# Patient Record
Sex: Female | Born: 1935 | Race: White | Hispanic: No | Marital: Married | Smoking: Former smoker
Health system: Southern US, Community
[De-identification: ages and names within clinical notes are randomized; demographics above are authoritative.]

## PROBLEM LIST (undated history)

## (undated) DIAGNOSIS — I2510 Atherosclerotic heart disease of native coronary artery without angina pectoris: Secondary | ICD-10-CM

## (undated) DIAGNOSIS — B9620 Unspecified Escherichia coli [E. coli] as the cause of diseases classified elsewhere: Secondary | ICD-10-CM

## (undated) DIAGNOSIS — H269 Unspecified cataract: Secondary | ICD-10-CM

## (undated) DIAGNOSIS — M1710 Unilateral primary osteoarthritis, unspecified knee: Secondary | ICD-10-CM

## (undated) DIAGNOSIS — Z9289 Personal history of other medical treatment: Secondary | ICD-10-CM

## (undated) DIAGNOSIS — I2584 Coronary atherosclerosis due to calcified coronary lesion: Secondary | ICD-10-CM

## (undated) DIAGNOSIS — K219 Gastro-esophageal reflux disease without esophagitis: Secondary | ICD-10-CM

## (undated) DIAGNOSIS — I10 Essential (primary) hypertension: Secondary | ICD-10-CM

## (undated) DIAGNOSIS — E785 Hyperlipidemia, unspecified: Secondary | ICD-10-CM

## (undated) DIAGNOSIS — I5030 Unspecified diastolic (congestive) heart failure: Secondary | ICD-10-CM

## (undated) DIAGNOSIS — I4891 Unspecified atrial fibrillation: Secondary | ICD-10-CM

## (undated) DIAGNOSIS — M179 Osteoarthritis of knee, unspecified: Secondary | ICD-10-CM

## (undated) DIAGNOSIS — E669 Obesity, unspecified: Secondary | ICD-10-CM

## (undated) DIAGNOSIS — N390 Urinary tract infection, site not specified: Secondary | ICD-10-CM

## (undated) HISTORY — DX: Atherosclerotic heart disease of native coronary artery without angina pectoris: I25.10

## (undated) HISTORY — DX: Unspecified atrial fibrillation: I48.91

## (undated) HISTORY — DX: Personal history of other medical treatment: Z92.89

## (undated) HISTORY — DX: Hyperlipidemia, unspecified: E78.5

## (undated) HISTORY — DX: Urinary tract infection, site not specified: B96.20

## (undated) HISTORY — DX: Gastro-esophageal reflux disease without esophagitis: K21.9

## (undated) HISTORY — PX: US ECHOCARDIOGRAPHY: HXRAD669

## (undated) HISTORY — DX: Unspecified diastolic (congestive) heart failure: I50.30

## (undated) HISTORY — DX: Essential (primary) hypertension: I10

## (undated) HISTORY — DX: Coronary atherosclerosis due to calcified coronary lesion: I25.84

## (undated) HISTORY — DX: Obesity, unspecified: E66.9

## (undated) HISTORY — DX: Urinary tract infection, site not specified: N39.0

## (undated) HISTORY — DX: Unilateral primary osteoarthritis, unspecified knee: M17.10

## (undated) HISTORY — DX: Unspecified cataract: H26.9

## (undated) HISTORY — DX: Osteoarthritis of knee, unspecified: M17.9

---

## 1998-02-15 ENCOUNTER — Emergency Department (HOSPITAL_COMMUNITY): Admission: EM | Admit: 1998-02-15 | Discharge: 1998-02-15 | Payer: Self-pay | Admitting: Emergency Medicine

## 2003-07-04 ENCOUNTER — Encounter: Admission: RE | Admit: 2003-07-04 | Discharge: 2003-09-13 | Payer: Self-pay | Admitting: *Deleted

## 2003-08-04 ENCOUNTER — Ambulatory Visit (HOSPITAL_COMMUNITY): Admission: RE | Admit: 2003-08-04 | Discharge: 2003-08-04 | Payer: Self-pay | Admitting: Gastroenterology

## 2003-10-11 ENCOUNTER — Encounter: Admission: RE | Admit: 2003-10-11 | Discharge: 2004-01-09 | Payer: Self-pay | Admitting: *Deleted

## 2004-01-10 ENCOUNTER — Encounter: Admission: RE | Admit: 2004-01-10 | Discharge: 2004-04-09 | Payer: Self-pay | Admitting: *Deleted

## 2009-03-27 ENCOUNTER — Encounter: Admission: RE | Admit: 2009-03-27 | Discharge: 2009-05-08 | Payer: Self-pay | Admitting: Family Medicine

## 2010-08-23 ENCOUNTER — Encounter: Admission: RE | Admit: 2010-08-23 | Discharge: 2010-08-23 | Payer: Self-pay | Admitting: Interventional Cardiology

## 2010-09-29 HISTORY — PX: CATARACT EXTRACTION, BILATERAL: SHX1313

## 2011-02-14 NOTE — Op Note (Signed)
   NAMEEZINNE, YOGI NO.:  [id number]   MEDICAL RECORD NO.:  [id number]                   PATIENT TYPE:  AMB   LOCATION:  ENDO                                 FACILITY:  [facility]   PHYSICIAN:  James L. Malon Kindle., M.D.          DATE OF BIRTH:  Jul 27, 1932   DATE OF PROCEDURE:  08/04/2003  DATE OF DISCHARGE:                                 OPERATIVE REPORT   PROCEDURE:  Colonoscopy.   MEDICATIONS:  Fentanyl 75 mcg, Versed 9 mg IV.   INDICATIONS FOR PROCEDURE:  Recent heme positive stool and diarrhea.   DESCRIPTION OF PROCEDURE:  The procedure had been explained to the patient  and consent obtained. With the patient in the left lateral decubitus  position, the Olympus pediatric adjustable scope was inserted and advanced.  The prep was excellent. Using abdominal pressure, we were able to reach  around the colon. The ileocecal valve and appendiceal orifice were seen. The  scope was withdrawn and the cecum, ascending colon, transverse colon,  splenic flexure, descending and sigmoid colon were seen well. Scattered  diverticula in the sigmoid colon, no other findings, no polyps or other  lesions. The scope was withdrawn in the rectum and there were internal  hemorrhoids seen in the rectum. The scope withdrawn. The patient tolerated  the procedure well.   ASSESSMENT:  Heme positive stool probably due to internal hemorrhoids.   PLAN:  Will have the patient followup with Dr. Theresia Lo and will see her  back on an as needed basis.                                               James L. Malon Kindle., M.D.    Waldron Session  D:  08/04/2003  T:  08/04/2003  Job:  [id number]   cc:   Vikki Ports, M.D.  [address] [place]  [place]  [place] [address]  Fax: [phone]

## 2011-10-29 ENCOUNTER — Encounter: Payer: Self-pay | Admitting: Emergency Medicine

## 2011-10-30 ENCOUNTER — Encounter: Payer: Self-pay | Admitting: Emergency Medicine

## 2011-10-30 ENCOUNTER — Ambulatory Visit (INDEPENDENT_AMBULATORY_CARE_PROVIDER_SITE_OTHER): Payer: Medicare Other | Admitting: Emergency Medicine

## 2011-10-30 DIAGNOSIS — I2789 Other specified pulmonary heart diseases: Secondary | ICD-10-CM | POA: Insufficient documentation

## 2011-10-30 DIAGNOSIS — I10 Essential (primary) hypertension: Secondary | ICD-10-CM | POA: Insufficient documentation

## 2011-10-30 DIAGNOSIS — E7800 Pure hypercholesterolemia, unspecified: Secondary | ICD-10-CM

## 2011-10-30 DIAGNOSIS — K219 Gastro-esophageal reflux disease without esophagitis: Secondary | ICD-10-CM

## 2011-10-30 DIAGNOSIS — R0609 Other forms of dyspnea: Secondary | ICD-10-CM

## 2011-10-30 DIAGNOSIS — I4891 Unspecified atrial fibrillation: Secondary | ICD-10-CM

## 2011-10-30 DIAGNOSIS — R0600 Dyspnea, unspecified: Secondary | ICD-10-CM

## 2011-10-30 DIAGNOSIS — Z87891 Personal history of nicotine dependence: Secondary | ICD-10-CM

## 2011-10-30 DIAGNOSIS — E785 Hyperlipidemia, unspecified: Secondary | ICD-10-CM | POA: Insufficient documentation

## 2011-10-30 DIAGNOSIS — E669 Obesity, unspecified: Secondary | ICD-10-CM

## 2011-10-30 NOTE — Assessment & Plan Note (Signed)
Suspect due to mild LVH given her left atrial enlargement. I do not believe this is a component of her dyspnea. Her RV size and function are reassuring. No indication for any further workup for PAH at this time

## 2011-10-30 NOTE — Assessment & Plan Note (Signed)
I agree with Dr. Hyacinth Meeker that her acute dyspnea likely related to some mild volume overload and pulmonary edema. She is much improved since starting lasix. Her echocardiogram supports changes do to systemic hypertension and atrial fibrillation. She does have a smoking history in there could be a component of COPD. At this time however she denies any shortness of breath and wants to defer pulmonary function testing. We have agreed to revisit this issue if her breathing changes in any way. She will followup with me if she become short-winded and we will order PFTs at that time.

## 2011-10-30 NOTE — Progress Notes (Signed)
Subjective:    Patient ID: Sabrina Delgado, female    DOB: Nov 17, 1931, 78 y.o.   MRN: [id number]  HPI 78 yo former smoker with hx HTN, hyperlipidemia, A Fib on coumadin. Referred for ? PAH. She had TTE done 10/06/11 that showed mild concentric LVH, LA enlargement, normal RV size and fxn, normal RA size. She has been breathing fairly well w exception of mid-January when she had acute dyspnea. She saw Dr Hyacinth Meeker, diuretics changed from HCTZ to lasix. Much improved at this time, lost 20 lbs. Denies snoring. Husband has never witnessed apneas, feels well rested in the am. She denies any wheezing, cough, chest tightness.    Review of Systems  Constitutional: Positive for unexpected weight change. Negative for fever.  HENT: Negative.  Negative for ear pain, nosebleeds, congestion, sore throat, rhinorrhea, sneezing, trouble swallowing, dental problem, postnasal drip and sinus pressure.   Eyes: Negative.  Negative for redness and itching.  Respiratory: Positive for shortness of breath. Negative for cough, chest tightness and wheezing.   Cardiovascular: Negative.  Negative for palpitations and leg swelling.  Gastrointestinal: Negative.  Negative for nausea and vomiting.  Genitourinary: Negative.  Negative for dysuria.  Musculoskeletal: Negative.  Negative for joint swelling.  Skin: Negative.  Negative for rash.  Neurological: Negative.  Negative for headaches.  Hematological: Negative.  Does not bruise/bleed easily.  Psychiatric/Behavioral: Negative for dysphoric mood. The patient is nervous/anxious.     Past Medical History  Diagnosis Date  . Hypertension   . Hyperlipidemia   . GERD (gastroesophageal reflux disease)   . Osteoarthritis, knee   . Atrial fibrillation   . Obesity   . Glaucoma   . E. coli UTI      Family History  Problem Relation Age of Onset  . Heart disease Father   . Heart disease Brother   . Stomach cancer Mother      History   Social History  . Marital Status:  Married    Spouse Name: N/A    Number of Children: N/A  . Years of Education: N/A   Occupational History  . HOMEMAKER    Social History Main Topics  . Smoking status: Former Smoker -- 1.0 packs/day for 25 years    Types: Cigarettes    Quit date: 09/29/1986  . Smokeless tobacco: Not on file  . Alcohol Use: No  . Drug Use: Not on file  . Sexually Active: Not on file   Other Topics Concern  . Not on file   Social History Narrative   MarriedChildren     No Known Allergies   Outpatient Prescriptions Prior to Visit  Medication Sig Dispense Refill  . acetaminophen (TYLENOL) 325 MG tablet Take 650 mg by mouth every 6 (six) hours as needed.      Marland Kitchen atenolol (TENORMIN) 100 MG tablet Take 100 mg by mouth daily.      . furosemide (LASIX) 20 MG tablet Take 20 mg by mouth every other day.       . lansoprazole (PREVACID) 30 MG capsule Take 30 mg by mouth daily before breakfast.      . lisinopril (PRINIVIL,ZESTRIL) 40 MG tablet Take 40 mg by mouth daily.      . pravastatin (PRAVACHOL) 40 MG tablet Take 40 mg by mouth at bedtime.      Marland Kitchen warfarin (COUMADIN) 2 MG tablet Take 2 mg by mouth daily.      Marland Kitchen desonide (DESOWEN) 0.05 % cream Apply 1 application topically daily.      Marland Kitchen  hydrochlorothiazide (HYDRODIURIL) 25 MG tablet Take 12.5 mg by mouth daily.             Objective:   Physical Exam  Gen: Pleasant, obese woman, in no distress,  normal affect  ENT: No lesions,  mouth clear,  oropharynx clear, no postnasal drip  Neck: No JVD, no TMG, no carotid bruits  Lungs: No use of accessory muscles, no dullness to percussion, clear without rales or rhonchi  Cardiovascular: irregularly irregular, 70's, no murmur or gallops, no peripheral edema  Musculoskeletal: No deformities, no cyanosis or clubbing  Neuro: alert, non focal  Skin: Warm, no lesions or rashes      Assessment & Plan:  Dyspnea I agree with Dr. Hyacinth Meeker that her acute dyspnea likely related to some mild volume overload  and pulmonary edema. She is much improved since starting lasix. Her echocardiogram supports changes do to systemic hypertension and atrial fibrillation. She does have a smoking history in there could be a component of COPD. At this time however she denies any shortness of breath and wants to defer pulmonary function testing. We have agreed to revisit this issue if her breathing changes in any way. She will followup with me if she become short-winded and we will order PFTs at that time.   Atrial fibrillation Good rate control and on Coumadin  Other chronic pulmonary heart diseases Suspect due to mild LVH given her left atrial enlargement. I do not believe this is a component of her dyspnea. Her RV size and function are reassuring. No indication for any further workup for PAH at this time

## 2011-10-30 NOTE — Patient Instructions (Signed)
Your echocardiogram shows changes that are consistent with your history of high blood pressure and atrial fibrillation If your breathing changes in any way in the future, we should do breathing tests to insure that this is not related to your smoking history Follow with Dr Katrinka Blazing as planned Follow with Dr Delton Coombes if your breathing worsens in any way.

## 2011-10-30 NOTE — Assessment & Plan Note (Signed)
Good rate control and on Coumadin

## 2013-04-06 ENCOUNTER — Emergency Department (HOSPITAL_COMMUNITY): Payer: Medicare Other

## 2013-04-06 ENCOUNTER — Inpatient Hospital Stay (HOSPITAL_COMMUNITY)
Admission: EM | Admit: 2013-04-06 | Discharge: 2013-04-09 | DRG: 872 | Disposition: A | Discharge status: Skilled Nursing Facility | Payer: Medicare Other | Attending: Internal Medicine | Admitting: Internal Medicine

## 2013-04-06 ENCOUNTER — Inpatient Hospital Stay (HOSPITAL_COMMUNITY): Payer: Medicare Other

## 2013-04-06 ENCOUNTER — Encounter (HOSPITAL_COMMUNITY): Payer: Self-pay | Admitting: Cardiology

## 2013-04-06 DIAGNOSIS — R0682 Tachypnea, not elsewhere classified: Secondary | ICD-10-CM | POA: Diagnosis present

## 2013-04-06 DIAGNOSIS — E860 Dehydration: Secondary | ICD-10-CM | POA: Diagnosis present

## 2013-04-06 DIAGNOSIS — I10 Essential (primary) hypertension: Secondary | ICD-10-CM | POA: Diagnosis present

## 2013-04-06 DIAGNOSIS — A419 Sepsis, unspecified organism: Principal | ICD-10-CM | POA: Diagnosis present

## 2013-04-06 DIAGNOSIS — W19XXXA Unspecified fall, initial encounter: Secondary | ICD-10-CM

## 2013-04-06 DIAGNOSIS — Y92009 Unspecified place in unspecified non-institutional (private) residence as the place of occurrence of the external cause: Secondary | ICD-10-CM

## 2013-04-06 DIAGNOSIS — K219 Gastro-esophageal reflux disease without esophagitis: Secondary | ICD-10-CM | POA: Diagnosis present

## 2013-04-06 DIAGNOSIS — I4891 Unspecified atrial fibrillation: Secondary | ICD-10-CM

## 2013-04-06 DIAGNOSIS — H409 Unspecified glaucoma: Secondary | ICD-10-CM | POA: Diagnosis present

## 2013-04-06 DIAGNOSIS — D72829 Elevated white blood cell count, unspecified: Secondary | ICD-10-CM | POA: Diagnosis present

## 2013-04-06 DIAGNOSIS — M25552 Pain in left hip: Secondary | ICD-10-CM

## 2013-04-06 DIAGNOSIS — E669 Obesity, unspecified: Secondary | ICD-10-CM | POA: Diagnosis present

## 2013-04-06 DIAGNOSIS — E785 Hyperlipidemia, unspecified: Secondary | ICD-10-CM | POA: Diagnosis present

## 2013-04-06 DIAGNOSIS — M247 Protrusio acetabuli: Secondary | ICD-10-CM | POA: Diagnosis present

## 2013-04-06 DIAGNOSIS — Z79899 Other long term (current) drug therapy: Secondary | ICD-10-CM

## 2013-04-06 DIAGNOSIS — M25559 Pain in unspecified hip: Secondary | ICD-10-CM | POA: Diagnosis present

## 2013-04-06 DIAGNOSIS — Z87891 Personal history of nicotine dependence: Secondary | ICD-10-CM

## 2013-04-06 DIAGNOSIS — Z7901 Long term (current) use of anticoagulants: Secondary | ICD-10-CM

## 2013-04-06 DIAGNOSIS — I498 Other specified cardiac arrhythmias: Secondary | ICD-10-CM | POA: Diagnosis present

## 2013-04-06 DIAGNOSIS — M1710 Unilateral primary osteoarthritis, unspecified knee: Secondary | ICD-10-CM | POA: Diagnosis present

## 2013-04-06 DIAGNOSIS — J9801 Acute bronchospasm: Secondary | ICD-10-CM

## 2013-04-06 DIAGNOSIS — N390 Urinary tract infection, site not specified: Secondary | ICD-10-CM | POA: Diagnosis present

## 2013-04-06 DIAGNOSIS — W108XXA Fall (on) (from) other stairs and steps, initial encounter: Secondary | ICD-10-CM | POA: Diagnosis present

## 2013-04-06 DIAGNOSIS — Z6833 Body mass index (BMI) 33.0-33.9, adult: Secondary | ICD-10-CM

## 2013-04-06 LAB — CBC WITH DIFFERENTIAL/PLATELET
Basophils Absolute: 0.1 10*3/uL (ref 0.0–0.1)
Basophils Relative: 0 % (ref 0–1)
Hemoglobin: 15.7 g/dL — ABNORMAL HIGH (ref 12.0–15.0)
Lymphocytes Relative: 8 % — ABNORMAL LOW (ref 12–46)
MCHC: 33.9 g/dL (ref 30.0–36.0)
Neutro Abs: 13.6 10*3/uL — ABNORMAL HIGH (ref 1.7–7.7)
Neutrophils Relative %: 86 % — ABNORMAL HIGH (ref 43–77)
RDW: 14.4 % (ref 11.5–15.5)
WBC: 15.8 10*3/uL — ABNORMAL HIGH (ref 4.0–10.5)

## 2013-04-06 LAB — URINALYSIS, ROUTINE W REFLEX MICROSCOPIC
Bilirubin Urine: NEGATIVE
Glucose, UA: NEGATIVE mg/dL
Ketones, ur: 15 mg/dL — AB
pH: 5.5 (ref 5.0–8.0)

## 2013-04-06 LAB — URINE MICROSCOPIC-ADD ON

## 2013-04-06 LAB — COMPREHENSIVE METABOLIC PANEL
ALT: 21 U/L (ref 0–35)
AST: 34 U/L (ref 0–37)
Albumin: 3.8 g/dL (ref 3.5–5.2)
Alkaline Phosphatase: 142 U/L — ABNORMAL HIGH (ref 39–117)
CO2: 23 mEq/L (ref 19–32)
Chloride: 106 mEq/L (ref 96–112)
Potassium: 3.6 mEq/L (ref 3.5–5.1)
Total Bilirubin: 1.6 mg/dL — ABNORMAL HIGH (ref 0.3–1.2)

## 2013-04-06 LAB — PROTIME-INR: Prothrombin Time: 23.2 seconds — ABNORMAL HIGH (ref 11.6–15.2)

## 2013-04-06 MED ORDER — SERTRALINE HCL 50 MG PO TABS
50.0000 mg | ORAL_TABLET | Freq: Every day | ORAL | Status: DC
  Administered 2013-04-07 – 2013-04-09 (×3): 50 mg via ORAL
  Filled 2013-04-06 (×4): qty 1

## 2013-04-06 MED ORDER — SODIUM CHLORIDE 0.9 % IV BOLUS (SEPSIS)
1000.0000 mL | Freq: Once | INTRAVENOUS | Status: AC
  Administered 2013-04-06: 1000 mL via INTRAVENOUS

## 2013-04-06 MED ORDER — WARFARIN - PHARMACIST DOSING INPATIENT: Freq: Every day | Status: DC

## 2013-04-06 MED ORDER — WARFARIN SODIUM 1 MG PO TABS
1.0000 mg | ORAL_TABLET | Freq: Once | ORAL | Status: AC
  Administered 2013-04-06: 1 mg via ORAL
  Filled 2013-04-06 (×2): qty 1

## 2013-04-06 MED ORDER — TRAMADOL HCL 50 MG PO TABS
50.0000 mg | ORAL_TABLET | Freq: Two times a day (BID) | ORAL | Status: DC
  Administered 2013-04-06 – 2013-04-09 (×6): 50 mg via ORAL
  Filled 2013-04-06 (×11): qty 1

## 2013-04-06 MED ORDER — SIMVASTATIN 40 MG PO TABS
40.0000 mg | ORAL_TABLET | Freq: Every day | ORAL | Status: DC
  Administered 2013-04-06 – 2013-04-08 (×3): 40 mg via ORAL
  Filled 2013-04-06 (×5): qty 1

## 2013-04-06 MED ORDER — WARFARIN SODIUM 1 MG PO TABS: 1.0000 mg | ORAL_TABLET | Freq: Every morning | ORAL | Status: DC

## 2013-04-06 MED ORDER — SODIUM CHLORIDE 0.9 % IV SOLN
Freq: Once | INTRAVENOUS | Status: AC
  Administered 2013-04-06: 15:00:00 via INTRAVENOUS

## 2013-04-06 MED ORDER — ONDANSETRON HCL 4 MG/2ML IJ SOLN: 4.0000 mg | Freq: Three times a day (TID) | INTRAMUSCULAR | Status: AC | PRN

## 2013-04-06 MED ORDER — DEXTROSE 5 % IV SOLN
1.0000 g | INTRAVENOUS | Status: DC
  Administered 2013-04-07 – 2013-04-08 (×2): 1 g via INTRAVENOUS
  Filled 2013-04-06 (×3): qty 10

## 2013-04-06 MED ORDER — LORAZEPAM 0.5 MG PO TABS
0.2500 mg | ORAL_TABLET | ORAL | Status: DC | PRN
  Administered 2013-04-08: 0.25 mg via ORAL
  Filled 2013-04-06: qty 1

## 2013-04-06 MED ORDER — SODIUM CHLORIDE 0.9 % IV SOLN
INTRAVENOUS | Status: DC
  Administered 2013-04-07: 04:00:00 via INTRAVENOUS

## 2013-04-06 MED ORDER — SODIUM CHLORIDE 0.9 % IJ SOLN
3.0000 mL | Freq: Two times a day (BID) | INTRAMUSCULAR | Status: DC
  Administered 2013-04-06 – 2013-04-08 (×4): 3 mL via INTRAVENOUS

## 2013-04-06 MED ORDER — LISINOPRIL 40 MG PO TABS
40.0000 mg | ORAL_TABLET | Freq: Every day | ORAL | Status: DC
  Administered 2013-04-07 – 2013-04-09 (×3): 40 mg via ORAL
  Filled 2013-04-06 (×4): qty 1

## 2013-04-06 MED ORDER — SODIUM CHLORIDE 0.9 % IV SOLN
INTRAVENOUS | Status: AC
  Administered 2013-04-06: 22:00:00 via INTRAVENOUS

## 2013-04-06 MED ORDER — ATENOLOL 100 MG PO TABS
100.0000 mg | ORAL_TABLET | Freq: Every day | ORAL | Status: DC
  Filled 2013-04-06 (×2): qty 1

## 2013-04-06 MED ORDER — ALBUTEROL SULFATE (5 MG/ML) 0.5% IN NEBU
INHALATION_SOLUTION | RESPIRATORY_TRACT | Status: AC
  Administered 2013-04-06: 5 mg
  Filled 2013-04-06: qty 1

## 2013-04-06 MED ORDER — DEXTROSE 5 % IV SOLN
1.0000 g | Freq: Once | INTRAVENOUS | Status: AC
  Administered 2013-04-06: 1 g via INTRAVENOUS
  Filled 2013-04-06: qty 10

## 2013-04-06 MED ORDER — ALBUTEROL SULFATE (5 MG/ML) 0.5% IN NEBU: 2.5000 mg | INHALATION_SOLUTION | RESPIRATORY_TRACT | Status: AC | PRN

## 2013-04-06 NOTE — ED Provider Notes (Signed)
Medical screening examination/treatment/procedure(s) were conducted as a shared visit with non-physician practitioner(s) and myself.  I personally evaluated the patient during the encounter  Subjective: Patient here to being found on the floor for 10 hours after she tripped.  Objective: Moderate wheezing noted on exam. No shortening or rotation of her hips for pain with range of motion.  Assessment/plan. Patient given albuterol here for wheezing. Labs and x-rays reviewed. Patient to have a CT of her head rule out fracture patient also has a urinary tract infection 2. Will be admitted to medicine  Toy Baker, MD 04/06/13 1527

## 2013-04-06 NOTE — ED Provider Notes (Signed)
History    CSN: [id number] Arrival date & time 04/06/13  1239  First MD Initiated Contact with Patient 04/06/13 1243     Chief Complaint  Patient presents with  . Fall   (Consider location/radiation/quality/duration/timing/severity/associated sxs/prior Treatment) HPI Comments: Patient reports that she fell down three stairs last night and landed on her back, was there all night. Daughter reports the patient does not go up and down stairs and was found this morning by her chair.  States she has been having occasional numbness in her left leg after sitting for extended periods of time and sometimes has difficulty feeling her leg when she first stands up - she thinks this may have caused the fall.  States she spoke with her on the phone at 9pm, then patient was found by caregiver who comes in at 11am.  Found on the floor by her chair.  Pt reports she was able to sleep on the floor a little, states she has not eaten or had anything to drink since last night.  Reports pain in her mid back only.  Denies pain anywhere else.  Denies recent illness, denies fever, chills, CP, SOB, cough, abdominal pain, N/V/D.  States she was eating and drinking well until last night.  Only problem recently has been left leg pain, is in physical therapy for this.      Patient is a 79 y.o. female presenting with fall. The history is provided by the patient and a relative.  Fall Pertinent negatives include no abdominal pain, chest pain, chills, coughing, fever, nausea, numbness, vomiting or weakness.   Past Medical History  Diagnosis Date  . Hypertension   . Hyperlipidemia   . GERD (gastroesophageal reflux disease)   . Osteoarthritis, knee   . Atrial fibrillation   . Obesity   . Glaucoma   . E. coli UTI    Past Surgical History  Procedure Laterality Date  . Cataract extraction, bilateral  1 2012   Family History  Problem Relation Age of Onset  . Heart disease Father   . Heart disease Brother   . Stomach  cancer Mother    History  Substance Use Topics  . Smoking status: Former Smoker -- 1.00 packs/day for 25 years    Types: Cigarettes    Quit date: 09/29/1986  . Smokeless tobacco: Not on file  . Alcohol Use: No   OB History   Grav Para Term Preterm Abortions TAB SAB Ect Mult Living                 Review of Systems  Constitutional: Negative for fever, chills, activity change and appetite change.  Respiratory: Negative for cough and shortness of breath.   Cardiovascular: Negative for chest pain.  Gastrointestinal: Negative for nausea, vomiting, abdominal pain and diarrhea.  Genitourinary: Negative for dysuria, urgency and frequency.  Musculoskeletal: Positive for back pain.  Skin: Negative for wound.  Neurological: Negative for weakness and numbness.  Psychiatric/Behavioral: Positive for confusion.  All other systems reviewed and are negative.    Allergies  Review of patient's allergies indicates no known allergies.  Home Medications   Current Outpatient Rx  Name  Route  Sig  Dispense  Refill  . acetaminophen (TYLENOL) 325 MG tablet   Oral   Take 650 mg by mouth every 6 (six) hours as needed.         Marland Kitchen atenolol (TENORMIN) 100 MG tablet   Oral   Take 100 mg by mouth daily.         Marland Kitchen  furosemide (LASIX) 20 MG tablet   Oral   Take 20 mg by mouth every other day.          . lansoprazole (PREVACID) 30 MG capsule   Oral   Take 30 mg by mouth daily before breakfast.         . lisinopril (PRINIVIL,ZESTRIL) 40 MG tablet   Oral   Take 40 mg by mouth daily.         . pravastatin (PRAVACHOL) 40 MG tablet   Oral   Take 40 mg by mouth at bedtime.         Marland Kitchen warfarin (COUMADIN) 2 MG tablet   Oral   Take 2 mg by mouth daily.          BP 136/64  Pulse 58  Temp(Src) 97.8 F (36.6 C) (Rectal)  Resp 20  SpO2 94% Physical Exam  Nursing note and vitals reviewed. Constitutional: She appears well-developed and well-nourished. No distress.  HENT:  Head:  Normocephalic and atraumatic.  Neck: Neck supple.  Cardiovascular: Normal rate, regular rhythm and intact distal pulses.   Pulmonary/Chest: Effort normal and breath sounds normal. No stridor. No respiratory distress. She has no wheezes. She has no rales.  Abdominal: Soft. She exhibits no distension. There is no tenderness. There is no rebound and no guarding.  Musculoskeletal:  Pt initially examined in hallway, pt left clothed. Left hip pain with passive ROM.   Back is nontender.   Moves all extremities.   Neurological: She is alert. She exhibits normal muscle tone. GCS eye subscore is 4. GCS verbal subscore is 5. GCS motor subscore is 6.  Right leg strength and upper extremity strength normal.  Left leg strength decreased - per daughter this is chronic.   Skin: She is not diaphoretic.    ED Course  Procedures (including critical care time) Labs Reviewed  CBC WITH DIFFERENTIAL - Abnormal; Notable for the following:    WBC 15.8 (*)    RBC 5.23 (*)    Hemoglobin 15.7 (*)    HCT 46.3 (*)    Neutrophils Relative % 86 (*)    Neutro Abs 13.6 (*)    Lymphocytes Relative 8 (*)    All other components within normal limits  COMPREHENSIVE METABOLIC PANEL - Abnormal; Notable for the following:    Glucose, Bld 118 (*)    Alkaline Phosphatase 142 (*)    Total Bilirubin 1.6 (*)    GFR calc non Af Amer 83 (*)    All other components within normal limits  CK - Abnormal; Notable for the following:    Total CK 561 (*)    All other components within normal limits  URINALYSIS, ROUTINE W REFLEX MICROSCOPIC - Abnormal; Notable for the following:    APPearance CLOUDY (*)    Hgb urine dipstick LARGE (*)    Ketones, ur 15 (*)    Protein, ur 100 (*)    Nitrite POSITIVE (*)    Leukocytes, UA SMALL (*)    All other components within normal limits  URINE MICROSCOPIC-ADD ON - Abnormal; Notable for the following:    Squamous Epithelial / LPF FEW (*)    Bacteria, UA MANY (*)    Casts GRANULAR CAST (*)     All other components within normal limits  URINE CULTURE  PROTIME-INR   Dg Chest 1 View  04/06/2013   *RADIOLOGY REPORT*  Clinical Data: Recent fall.  Back pain and chest pain.  CHEST - 1 VIEW  Comparison: .  10/02/2011.  Findings: Cardiomegaly.  Interstitial and alveolar prominence could be related in part to supine radiography, but early congestive failure is not excluded.  Calcified tortuous aorta.  No rib fracture or pneumothorax. Worsening aeration from priors.  IMPRESSION: Cardiomegaly.  Early congestive failure not excluded.  No pneumothorax is seen.   Original Report Authenticated By: Davonna Belling, M.D.   Dg Thoracic Spine 2 View  04/06/2013   *RADIOLOGY REPORT*  Clinical Data: Larey Seat.  Back pain.  THORACIC SPINE - 2 VIEW  Comparison: Chest x-ray 10/02/2011.  Findings: Slight mid thoracic compression fracture is stable from 2013.  Multilevel degenerative change with disc space narrowing. Osteopenia.  No acute compression deformity is evident.  Mild scoliosis convex right lower thoracic region.  Bilateral interstitial lung disease.  Calcified tortuous aorta.  IMPRESSION: No definite acute osseous findings.   Original Report Authenticated By: Davonna Belling, M.D.   Dg Lumbar Spine Complete  04/06/2013   *RADIOLOGY REPORT*  Clinical Data: Larey Seat, back pain.  LUMBAR SPINE - COMPLETE 4+ VIEW  Comparison: None.  Findings: Degenerative scoliosis mildly convex left mid lumbar region.  Multilevel degenerative change most notable at L4-5 and L5- S1.  No definite acute compression deformity.  Mild endplate softening above and below the L4-5 level appears chronic.  Lower lumbar facet arthropathy.  Vascular calcification.  IMPRESSION: No definite acute lumbar compression deformity.   Original Report Authenticated By: Davonna Belling, M.D.   Dg Hip Complete Left  04/06/2013   *RADIOLOGY REPORT*  Clinical Data: Larey Seat, hip pain, unable to see a  LEFT HIP - COMPLETE 2+ VIEW  Comparison: None.  Findings: The appearance of  the left hip and acetabulum is markedly abnormal, with significant protrusio acetabuli, remodeled pubic and ischial rami, and a deformed misshapen femoral head.  This appearance is likely chronic although it is difficult to exclude a superimposed acute injury.  If there is concern for acute pelvic or hip fracture, CT would be necessary.  There is advanced osteopenia. The right hip is grossly unremarkable.  IMPRESSION: Severe left protrusio acetabuli, with a deformed and misshapen left femoral head.  Difficult to exclude a superimposed acute abnormality.  Correlate clinically.   Original Report Authenticated By: Davonna Belling, M.D.   Ct Head Wo Contrast  04/06/2013   *RADIOLOGY REPORT*  Clinical Data:  Fall, altered mental status, head pain, neck pain  CT HEAD WITHOUT CONTRAST CT CERVICAL SPINE WITHOUT CONTRAST  Technique:  Multidetector CT imaging of the head and cervical spine was performed following the standard protocol without intravenous contrast.  Multiplanar CT image reconstructions of the cervical spine were also generated.  Comparison:   None  CT HEAD  Findings: There is no evidence for acute infarction, intracranial hemorrhage, mass lesion, hydrocephalus, or extra-axial fluid.  Mild atrophy.  Chronic microvascular ischemic change, moderate degree. Possible remote right frontal cortical infarct.   Calvarium intact. Right parietal scalp hematoma.  There is no subarachnoid blood. Hyperostosis frontalis interna.  Left maxillary sinus air-fluid level. Minimal fluid layering in the sphenoid sinus.  Mild mucosal thickening in the ethmoid sinuses.  No orbital findings.  Bilateral cataract extraction.  No mastoid fluid.  IMPRESSION: No acute intracranial findings.  Mild atrophy and chronic microvascular ischemic change. Right parietal scalp hematoma. Sinus air-fluid levels could be incidental/inflammatory, as there is no visible facial deformity.  CT CERVICAL SPINE  Findings: No visible cervical spine fracture or  traumatic subluxation.  Multilevel disc space narrowing without prevertebral soft tissue swelling or intraspinal hematoma. Mild pannus  is non compressive. Functional fusion C4-C5. Multilevel spondylosis with uncinate spurring and facet arthropathy narrow the neural foramina, left greater than right, at multiple levels.  Mild atheromatous change.  No pneumothorax.  IMPRESSION: Spondylosis.  No acute findings.   Original Report Authenticated By: Davonna Belling, M.D.   Ct Cervical Spine Wo Contrast  04/06/2013   *RADIOLOGY REPORT*  Clinical Data:  Fall, altered mental status, head pain, neck pain  CT HEAD WITHOUT CONTRAST CT CERVICAL SPINE WITHOUT CONTRAST  Technique:  Multidetector CT imaging of the head and cervical spine was performed following the standard protocol without intravenous contrast.  Multiplanar CT image reconstructions of the cervical spine were also generated.  Comparison:   None  CT HEAD  Findings: There is no evidence for acute infarction, intracranial hemorrhage, mass lesion, hydrocephalus, or extra-axial fluid.  Mild atrophy.  Chronic microvascular ischemic change, moderate degree. Possible remote right frontal cortical infarct.   Calvarium intact. Right parietal scalp hematoma.  There is no subarachnoid blood. Hyperostosis frontalis interna.  Left maxillary sinus air-fluid level. Minimal fluid layering in the sphenoid sinus.  Mild mucosal thickening in the ethmoid sinuses.  No orbital findings.  Bilateral cataract extraction.  No mastoid fluid.  IMPRESSION: No acute intracranial findings.  Mild atrophy and chronic microvascular ischemic change. Right parietal scalp hematoma. Sinus air-fluid levels could be incidental/inflammatory, as there is no visible facial deformity.  CT CERVICAL SPINE  Findings: No visible cervical spine fracture or traumatic subluxation.  Multilevel disc space narrowing without prevertebral soft tissue swelling or intraspinal hematoma. Mild pannus is non compressive.  Functional fusion C4-C5. Multilevel spondylosis with uncinate spurring and facet arthropathy narrow the neural foramina, left greater than right, at multiple levels.  Mild atheromatous change.  No pneumothorax.  IMPRESSION: Spondylosis.  No acute findings.   Original Report Authenticated By: Davonna Belling, M.D.   Dg Chest Portable 1 View  04/06/2013   *RADIOLOGY REPORT*  Clinical Data: Fall with shortness of breath.  Hypertension.  PORTABLE CHEST - 1 VIEW  Comparison: Portable film earlier in the day.  Findings: Semi erect film demonstrates continued cardiac enlargement with interstitial and alveolar prominence.  There is slight redistribution of fluid to the mid and lower lung zones but considerable edema remains.  Congestive failure not excluded.  No pneumothorax.  No rib fracture.  IMPRESSION: Continued cardiac enlargement with persistent interstitial and alveolar prominence.  Little change from earlier despite semi erect portable radiograph positioning.   Original Report Authenticated By: Davonna Belling, M.D.    1:28 PM Dr Freida Busman made aware of the patient.   3:36 PM Pt seen again with Dr Freida Busman.  Discussed results.  Pt became tachypneic - she and family member state this happen when she is nervous.  Has some wheezing on exam.  1 view portable chest and albuterol ordered.    1. Fall, initial encounter   2. UTI (lower urinary tract infection)   3. Dehydration   4. Left hip pain   5. Bronchospasm     MDM  Pt with fall, on floor overnight, c/o back pain and chronic left hip pain.  Pt found to be dehydrated (heme concentrated, CK elevated) with UTI (pos nitrites, WBC elev), ?changes on left hip xray.  Elevated bilirubin and alk phos - pt without abdominal pain or tenderness.  Given IVF and rocephin in ED.  CT hip ordered to r/o fracture.  See above discussion of tachypnea - pt denies SOB, she and family member state this is her normal anxiety  reaction. Admitted to [facility].     [place],  PA-C 04/06/13 1601

## 2013-04-06 NOTE — ED Notes (Signed)
Pt to department via EMS- reports that she fell at 0100 this morning and has been on the floor since then. EMS reports that they found her on the floor this morning. Pt is alert and oriented but family reports that she seems "alittle off to her". Pt reports lower back pain. Pt reports that she has trouble with her leg and that's what caused her to fall. On coumadin for a-fib, no bleeding noted. Bp- 132/68 Hr-64 CBG-111

## 2013-04-06 NOTE — ED Notes (Signed)
Admitting MD at the bedside.  
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
## 2013-04-06 NOTE — ED Notes (Signed)
Breathing tx completed, PA informed. Pt presents with wheezing after breathing tx however the wheezing has decreased from the tx. PA informed. PA states she will reevaluate pt for further orders at this time. Family at the bedside. Pt presents with anxiety.

## 2013-04-06 NOTE — Progress Notes (Signed)
ANTICOAGULATION CONSULT NOTE - Initial Consult  Pharmacy Consult for warfarin Indication: atrial fibrillation  No Known Allergies  Vital Signs: Temp: 97.9 F (36.6 C) (07/09 1545) Temp src: Oral (07/09 1545) BP: 141/60 mmHg (07/09 1635) Pulse Rate: 58 (07/09 1635)  Labs:  Recent Labs  04/06/13 1319 04/06/13 1919  HGB 15.7*  --   HCT 46.3*  --   PLT 211  --   LABPROT  --  23.2*  INR  --  2.14*  CREATININE 0.64  --   CKTOTAL 561*  --     The CrCl is unknown because both a height and weight (above a minimum accepted value) are required for this calculation.   Medical History: Past Medical History  Diagnosis Date  . Hypertension   . Hyperlipidemia   . GERD (gastroesophageal reflux disease)   . Osteoarthritis, knee   . Atrial fibrillation   . Obesity   . Glaucoma   . E. coli UTI     Medications:  Warfarin 1mg  PO daily  Assessment: 80 yof s/p fall found down by family on chronic coumadin for afib. To resume coumadin tonight. No evidence of bleeding, H/H 15.7/46.3, plts 211. INR is therapeutic at 2.14.   Goal of Therapy:  INR 2-3   Plan:  1. Warfarin 1mg  PO x 1 tonight 2. Daily INR  Emmagrace Runkel, Drake Leach 04/06/2013,7:52 PM

## 2013-04-07 DIAGNOSIS — I4891 Unspecified atrial fibrillation: Secondary | ICD-10-CM

## 2013-04-07 DIAGNOSIS — E860 Dehydration: Secondary | ICD-10-CM

## 2013-04-07 DIAGNOSIS — M25559 Pain in unspecified hip: Secondary | ICD-10-CM

## 2013-04-07 DIAGNOSIS — N390 Urinary tract infection, site not specified: Secondary | ICD-10-CM

## 2013-04-07 DIAGNOSIS — W19XXXA Unspecified fall, initial encounter: Secondary | ICD-10-CM

## 2013-04-07 LAB — COMPREHENSIVE METABOLIC PANEL
ALT: 19 U/L (ref 0–35)
AST: 34 U/L (ref 0–37)
CO2: 22 mEq/L (ref 19–32)
Calcium: 8.6 mg/dL (ref 8.4–10.5)
Potassium: 3 mEq/L — ABNORMAL LOW (ref 3.5–5.1)
Sodium: 143 mEq/L (ref 135–145)
Total Protein: 6 g/dL (ref 6.0–8.3)

## 2013-04-07 LAB — PROTIME-INR: INR: 2.36 — ABNORMAL HIGH (ref 0.00–1.49)

## 2013-04-07 LAB — CBC
HCT: 39.7 % (ref 36.0–46.0)
Hemoglobin: 13 g/dL (ref 12.0–15.0)
MCH: 29.4 pg (ref 26.0–34.0)
MCHC: 32.7 g/dL (ref 30.0–36.0)

## 2013-04-07 MED ORDER — POTASSIUM CHLORIDE CRYS ER 20 MEQ PO TBCR
40.0000 meq | EXTENDED_RELEASE_TABLET | Freq: Once | ORAL | Status: AC
  Administered 2013-04-07: 40 meq via ORAL
  Filled 2013-04-07: qty 2

## 2013-04-07 MED ORDER — WARFARIN SODIUM 1 MG PO TABS
1.0000 mg | ORAL_TABLET | Freq: Once | ORAL | Status: AC
  Administered 2013-04-07: 1 mg via ORAL
  Filled 2013-04-07: qty 1

## 2013-04-07 MED ORDER — FUROSEMIDE 20 MG PO TABS
20.0000 mg | ORAL_TABLET | Freq: Every day | ORAL | Status: DC
  Administered 2013-04-07 – 2013-04-09 (×3): 20 mg via ORAL
  Filled 2013-04-07 (×3): qty 1

## 2013-04-07 NOTE — Progress Notes (Signed)
ANTICOAGULATION CONSULT NOTE - Follow Up  Pharmacy Consult for Warfarin Indication: atrial fibrillation  No Known Allergies  Vital Signs: Temp: 98.1 F (36.7 C) (07/10 0418) BP: 132/73 mmHg (07/10 0418) Pulse Rate: 59 (07/10 0418)  Labs:  Recent Labs  04/06/13 1319 04/06/13 1919 04/07/13 0750  HGB 15.7*  --  13.0  HCT 46.3*  --  39.7  PLT 211  --  188  LABPROT  --  23.2* 25.0*  INR  --  2.14* 2.36*  CREATININE 0.64  --  0.65  CKTOTAL 561*  --   --     Estimated Creatinine Clearance: 65 ml/min (by C-G formula based on Cr of 0.65).   Assessment: 79 yo F s/p fall found down by family on  7/9. On warfarin prior to admission for hx of Afib. Home warfarin dose reported as 1mg  daily. INR was therapeutic on admission. Transfer[facility] from [facility] to [facility] per patient request.  INR remains therapeutic this am  Will continue to follow usual home regimen  No bleeding events noted in chart notes.  H/H, plts wnl  Goal of Therapy:  INR 2-3   Plan:  1. Warfarin 1mg  PO x 1 tonight 2. Daily INR  Darrol Angel, PharmD Pager: [phone] 04/07/2013,10:43 AM
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
## 2013-04-07 NOTE — Progress Notes (Signed)
Stopped to see patient today. Gave support and listened. Offered prayer for peace and calm.
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
## 2013-04-07 NOTE — Progress Notes (Addendum)
Pt's HR has dropped into the 30s while asleep. Tele notified RN of a HR of 37, MD on call also notified. No new orders were given at this time, as pt is asymptomatic and sleeping comfortably.  Will continue to monitor pt.   Pts HR went down to as low as 30, and MD on call notified again.  Pt has no complaints of pain or feeling bad.  She is currently resting comfortably, and her vitals are stable.  She actually states she has been sleeping well and feels much better. Will continue to monitor.
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
## 2013-04-07 NOTE — Care Management Note (Signed)
    Page 1 of 1   04/07/2013     1:34:34 PM   CARE MANAGEMENT NOTE 04/07/2013  Patient:  Sabrina Delgado,Sabrina Delgado   Account Number:  [id number]  Date Initiated:  04/07/2013  Documentation initiated by:  Lanier Clam  Subjective/Objective Assessment:   ADMITTED W/SEPSIS,UTI,FALL.     Action/Plan:   FROM HOME.HAS PERSONALCAREGIVER.   Anticipated DC Date:  04/11/2013   Anticipated DC Plan:  SKILLED NURSING FACILITY      DC Planning Services  CM consult      Choice offered to / List presented to:             Status of service:  In process, will continue to follow Medicare Important Message given?   (If response is "NO", the following Medicare IM given date fields will be blank) Date Medicare IM given:   Date Additional Medicare IM given:    Discharge Disposition:    Per UR Regulation:  Reviewed for med. necessity/level of care/duration of stay  If discussed at Long Length of Stay Meetings, dates discussed:    Comments:  04/07/13 Hally Colella RN,BSN NCM [phone] PT/OT-SNF.

## 2013-04-08 LAB — BASIC METABOLIC PANEL
BUN: 13 mg/dL (ref 6–23)
Calcium: 9.1 mg/dL (ref 8.4–10.5)
GFR calc non Af Amer: 85 mL/min — ABNORMAL LOW (ref >90)
Glucose, Bld: 113 mg/dL — ABNORMAL HIGH (ref 70–99)

## 2013-04-08 LAB — CBC
HCT: 41.9 % (ref 36.0–46.0)
Hemoglobin: 13.7 g/dL (ref 12.0–15.0)
MCH: 29.4 pg (ref 26.0–34.0)
MCHC: 32.7 g/dL (ref 30.0–36.0)
MCV: 89.9 fL (ref 78.0–100.0)

## 2013-04-08 MED ORDER — POTASSIUM CHLORIDE CRYS ER 20 MEQ PO TBCR
40.0000 meq | EXTENDED_RELEASE_TABLET | Freq: Two times a day (BID) | ORAL | Status: AC
  Administered 2013-04-08 (×2): 40 meq via ORAL
  Filled 2013-04-08 (×2): qty 2

## 2013-04-08 MED ORDER — WARFARIN SODIUM 1 MG PO TABS
1.0000 mg | ORAL_TABLET | Freq: Once | ORAL | Status: AC
  Administered 2013-04-08: 1 mg via ORAL
  Filled 2013-04-08: qty 1

## 2013-04-08 NOTE — Progress Notes (Signed)
[facility] PROGRESS NOTE  Sabrina Delgado AVW:[id number] DOB: Nov 15, 1931 DOA: 04/06/2013 PCP: Neldon Labella, MD  Assessment/Plan: UTI with ? sepsis:  -Pt with leukocytosis and tachypnea in the setting of UTI  -Continue with rocephin day 3.  -Urine culture E coli. Will follow sensitivity to decide which antibiotics use at discharge.  -WBC today at 12.   HTN:  - BP stable. Continue with lisinopril. Hold BB due to bradycardia.  -resume home lasix dose.   A.fib:  - Coumadin pre pharmacy.  - Presently stable.  -Holding BB due to bradycardia.  -Consider resume BB if HR increase.   Fall:  - No acute fracture noted on plain xray  - CT hip negative for acute fracture. Acetabulum protrusio will ask ortho to review CT.  Discussed CT scan results with Dr Carola Frost, patient need PT rehab, needs to follow up with Ortho, Dr Charlann Boxer for evaluation for surgery.   Dehydration:  -No evidence of rhabdo s/p fall.  -NSL fluids, patient tolerating diet.    Bradycardia; holding BB. Asymptomatic.    Code Status:  Family Communication: Care discussed with daughter.  Disposition Plan:  SNF 7-12   Consultants:  none  Procedures:  none  Antibiotics:  Ceftriaxone 7-9  HPI/Subjective: Feeling better. No significant hip pain. Was able to put weight on her left hip.   Objective: Filed Vitals:   04/07/13 1015 04/07/13 1300 04/07/13 2251 04/08/13 0417  BP:  121/77 142/98 133/79  Pulse: 52 60 79 62  Temp:  98.5 F (36.9 C) 97.9 F (36.6 C) 97.8 F (36.6 C)  TempSrc:   Oral Oral  Resp:  22 20 24   Height:      Weight:      SpO2: 82% 95% 93% 91%    Intake/Output Summary (Last 24 hours) at 04/08/13 1320 Last data filed at 04/08/13 0730  Gross per 24 hour  Intake      0 ml  Output    450 ml  Net   -450 ml   Filed Weights   04/06/13 2053  Weight: 91.944 kg (202 lb 11.2 oz)    Exam:   General:  No distress.   Cardiovascular: S 1, S 2 RRR  Respiratory: CTA  Abdomen: Bs  present, soft, NT  Musculoskeletal: no edema.   Data Reviewed: Basic Metabolic Panel:  Recent Labs Lab 04/06/13 1319 04/07/13 0750 04/08/13 0414  NA 143 143 141  K 3.6 3.0* 3.3*  CL 106 109 106  CO2 23 22 24   GLUCOSE 118* 101* 113*  BUN 19 15 13   CREATININE 0.64 0.65 0.61  CALCIUM 9.6 8.6 9.1   Liver Function Tests:  Recent Labs Lab 04/06/13 1319 04/07/13 0750  AST 34 34  ALT 21 19  ALKPHOS 142* 99  BILITOT 1.6* 1.5*  PROT 7.5 6.0  ALBUMIN 3.8 2.8*   No results found for this basename: LIPASE, AMYLASE,  in the last 168 hours No results found for this basename: AMMONIA,  in the last 168 hours CBC:  Recent Labs Lab 04/06/13 1319 04/07/13 0750 04/08/13 0414  WBC 15.8* 10.9* 12.2*  NEUTROABS 13.6*  --   --   HGB 15.7* 13.0 13.7  HCT 46.3* 39.7 41.9  MCV 88.5 89.8 89.9  PLT 211 188 183   Cardiac Enzymes:  Recent Labs Lab 04/06/13 1319  CKTOTAL 561*   BNP (last 3 results) No results found for this basename: PROBNP,  in the last 8760 hours CBG: No results found for this basename: GLUCAP,  in the last 168 hours  Recent Results (from the past 240 hour(s))  URINE CULTURE     Status: None   Collection Time    04/06/13  1:20 PM      Result Value Range Status   Specimen Description URINE, CATHETERIZED   Final   Special Requests NONE   Final   Culture  Setup Time 04/06/2013 17:12   Final   Colony Count >=100,000 COLONIES/ML   Final   Culture ESCHERICHIA COLI   Final   Report Status PENDING   Incomplete     Studies: Dg Chest 1 View  04/06/2013   *RADIOLOGY REPORT*  Clinical Data: Recent fall.  Back pain and chest pain.  CHEST - 1 VIEW  Comparison: .  10/02/2011.  Findings: Cardiomegaly.  Interstitial and alveolar prominence could be related in part to supine radiography, but early congestive failure is not excluded.  Calcified tortuous aorta.  No rib fracture or pneumothorax. Worsening aeration from priors.  IMPRESSION: Cardiomegaly.  Early congestive  failure not excluded.  No pneumothorax is seen.   Original Report Authenticated By: Davonna Belling, M.D.   Dg Thoracic Spine 2 View  04/06/2013   *RADIOLOGY REPORT*  Clinical Data: Larey Seat.  Back pain.  THORACIC SPINE - 2 VIEW  Comparison: Chest x-ray 10/02/2011.  Findings: Slight mid thoracic compression fracture is stable from 2013.  Multilevel degenerative change with disc space narrowing. Osteopenia.  No acute compression deformity is evident.  Mild scoliosis convex right lower thoracic region.  Bilateral interstitial lung disease.  Calcified tortuous aorta.  IMPRESSION: No definite acute osseous findings.   Original Report Authenticated By: Davonna Belling, M.D.   Dg Lumbar Spine Complete  04/06/2013   *RADIOLOGY REPORT*  Clinical Data: Larey Seat, back pain.  LUMBAR SPINE - COMPLETE 4+ VIEW  Comparison: None.  Findings: Degenerative scoliosis mildly convex left mid lumbar region.  Multilevel degenerative change most notable at L4-5 and L5- S1.  No definite acute compression deformity.  Mild endplate softening above and below the L4-5 level appears chronic.  Lower lumbar facet arthropathy.  Vascular calcification.  IMPRESSION: No definite acute lumbar compression deformity.   Original Report Authenticated By: Davonna Belling, M.D.   Dg Hip Complete Left  04/06/2013   *RADIOLOGY REPORT*  Clinical Data: Larey Seat, hip pain, unable to see a  LEFT HIP - COMPLETE 2+ VIEW  Comparison: None.  Findings: The appearance of the left hip and acetabulum is markedly abnormal, with significant protrusio acetabuli, remodeled pubic and ischial rami, and a deformed misshapen femoral head.  This appearance is likely chronic although it is difficult to exclude a superimposed acute injury.  If there is concern for acute pelvic or hip fracture, CT would be necessary.  There is advanced osteopenia. The right hip is grossly unremarkable.  IMPRESSION: Severe left protrusio acetabuli, with a deformed and misshapen left femoral head.  Difficult to exclude  a superimposed acute abnormality.  Correlate clinically.   Original Report Authenticated By: Davonna Belling, M.D.   Ct Head Wo Contrast  04/06/2013   *RADIOLOGY REPORT*  Clinical Data:  Fall, altered mental status, head pain, neck pain  CT HEAD WITHOUT CONTRAST CT CERVICAL SPINE WITHOUT CONTRAST  Technique:  Multidetector CT imaging of the head and cervical spine was performed following the standard protocol without intravenous contrast.  Multiplanar CT image reconstructions of the cervical spine were also generated.  Comparison:   None  CT HEAD  Findings: There is no evidence for acute infarction, intracranial hemorrhage, mass lesion,  hydrocephalus, or extra-axial fluid.  Mild atrophy.  Chronic microvascular ischemic change, moderate degree. Possible remote right frontal cortical infarct.   Calvarium intact. Right parietal scalp hematoma.  There is no subarachnoid blood. Hyperostosis frontalis interna.  Left maxillary sinus air-fluid level. Minimal fluid layering in the sphenoid sinus.  Mild mucosal thickening in the ethmoid sinuses.  No orbital findings.  Bilateral cataract extraction.  No mastoid fluid.  IMPRESSION: No acute intracranial findings.  Mild atrophy and chronic microvascular ischemic change. Right parietal scalp hematoma. Sinus air-fluid levels could be incidental/inflammatory, as there is no visible facial deformity.  CT CERVICAL SPINE  Findings: No visible cervical spine fracture or traumatic subluxation.  Multilevel disc space narrowing without prevertebral soft tissue swelling or intraspinal hematoma. Mild pannus is non compressive. Functional fusion C4-C5. Multilevel spondylosis with uncinate spurring and facet arthropathy narrow the neural foramina, left greater than right, at multiple levels.  Mild atheromatous change.  No pneumothorax.  IMPRESSION: Spondylosis.  No acute findings.   Original Report Authenticated By: Davonna Belling, M.D.   Ct Cervical Spine Wo Contrast  04/06/2013   *RADIOLOGY  REPORT*  Clinical Data:  Fall, altered mental status, head pain, neck pain  CT HEAD WITHOUT CONTRAST CT CERVICAL SPINE WITHOUT CONTRAST  Technique:  Multidetector CT imaging of the head and cervical spine was performed following the standard protocol without intravenous contrast.  Multiplanar CT image reconstructions of the cervical spine were also generated.  Comparison:   None  CT HEAD  Findings: There is no evidence for acute infarction, intracranial hemorrhage, mass lesion, hydrocephalus, or extra-axial fluid.  Mild atrophy.  Chronic microvascular ischemic change, moderate degree. Possible remote right frontal cortical infarct.   Calvarium intact. Right parietal scalp hematoma.  There is no subarachnoid blood. Hyperostosis frontalis interna.  Left maxillary sinus air-fluid level. Minimal fluid layering in the sphenoid sinus.  Mild mucosal thickening in the ethmoid sinuses.  No orbital findings.  Bilateral cataract extraction.  No mastoid fluid.  IMPRESSION: No acute intracranial findings.  Mild atrophy and chronic microvascular ischemic change. Right parietal scalp hematoma. Sinus air-fluid levels could be incidental/inflammatory, as there is no visible facial deformity.  CT CERVICAL SPINE  Findings: No visible cervical spine fracture or traumatic subluxation.  Multilevel disc space narrowing without prevertebral soft tissue swelling or intraspinal hematoma. Mild pannus is non compressive. Functional fusion C4-C5. Multilevel spondylosis with uncinate spurring and facet arthropathy narrow the neural foramina, left greater than right, at multiple levels.  Mild atheromatous change.  No pneumothorax.  IMPRESSION: Spondylosis.  No acute findings.   Original Report Authenticated By: Davonna Belling, M.D.   Ct Hip Left Wo Contrast  04/06/2013   *RADIOLOGY REPORT*  Clinical Data: Found on floor, left hip pain  CT OF THE LEFT HIP WITHOUT CONTRAST  Technique:  Multidetector CT imaging was performed according to the standard  protocol. Multiplanar CT image reconstructions were also generated.  Comparison: Radiographs of 04/06/2013  Findings: Subcutaneous edema lateral to the left pelvis. Left gluteal muscle atrophy. Diffuse osseous demineralization. Left iliac deformity likely related to old healed fracture. Old fractures of the left superior and inferior pubic rami. Left acetabulum protrusio. Advanced osteoarthritic changes of left hip joint with joint space narrowing and spur formation as well as scattered subchondral cysts. No definite acute fracture, dislocation, or bone destruction.  IMPRESSION: Post-traumatic deformities of the left pelvis secondary to remote trauma. 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 No definite acute fracture or dislocation identified.  Original Report Authenticated By: Ulyses Southward, M.D.   Dg Chest Portable 1 View  04/06/2013   *RADIOLOGY REPORT*  Clinical Data: Fall with shortness of breath.  Hypertension.  PORTABLE CHEST - 1 VIEW  Comparison: Portable film earlier in the day.  Findings: Semi erect film demonstrates continued cardiac enlargement with interstitial and alveolar prominence.  There is slight redistribution of fluid to the mid and lower lung zones but considerable edema remains.  Congestive failure not excluded.  No pneumothorax.  No rib fracture.  IMPRESSION: Continued cardiac enlargement with persistent interstitial and alveolar prominence.  Little change from earlier despite semi erect portable radiograph positioning.   Original Report Authenticated By: Davonna Belling, M.D.    Scheduled Meds: . cefTRIAXone (ROCEPHIN)  IV  1 g Intravenous Q24H  . furosemide  20 mg Oral Daily  . lisinopril  40 mg Oral Daily  . potassium chloride  40 mEq Oral BID  . sertraline  50 mg Oral Daily  . simvastatin  40 mg Oral q1800  . sodium chloride  3 mL Intravenous Q12H  . traMADol  50 mg Oral Q12H  . warfarin  1 mg Oral ONCE-1800  . Warfarin -  Pharmacist Dosing Inpatient   Does not apply q1800   Continuous Infusions:   Principal Problem:   Sepsis secondary to UTI Active Problems:   Hypertension   Hyperlipidemia   GERD (gastroesophageal reflux disease)   Atrial fibrillation    Time spent: 25 minutes.    Cyndia Degraff  [facility] Pager [phone]. If 7PM-7AM, please contact night-coverage at [email or web address], password [facility] 04/08/2013, 1:20 PM  LOS: 2 days

## 2013-04-08 NOTE — Progress Notes (Signed)
Med rec sent to [facility]. They will accept patient on Saturday.  Sabrina Delgado C. Sabrina Delgado MSW, LCSW [phone]

## 2013-04-08 NOTE — Progress Notes (Signed)
Patient accepted at [facility]. Informed family. [name] will contact them about paperwork.  Jaceyon Strole C. Messiah Ahr MSW, LCSW [phone]
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
## 2013-04-08 NOTE — Clinical Social Work Psychosocial (Signed)
     Clinical Social Work Department BRIEF PSYCHOSOCIAL ASSESSMENT 04/08/2013  Patient:  Sabrina Delgado,Sabrina Delgado     Account Number:  [id number]     Admit date:  04/06/2013  Clinical Social Worker:  Hattie Perch  Date/Time:  04/08/2013 12:00 M  Referred by:  Physician  Date Referred:  04/08/2013 Referred for  SNF Placement   Other Referral:   Interview type:  Patient Other interview type:   family at bedside    PSYCHOSOCIAL DATA Living Status:  ALONE Admitted from facility:   Level of care:   Primary support name:  Bufford Spikes Primary support relationship to patient:  CHILD, ADULT Degree of support available:   excellent    CURRENT CONCERNS Current Concerns  Post-Acute Placement   Other Concerns:    SOCIAL WORK ASSESSMENT / PLAN CSW met with patient and son in law at bedside. patient is alert and oriented X4. patient in need of snf placement. Patient states that she has done outpatient therapy before but agrees that she would need snf this time. Patient's son in law, Sabrina Delgado, states that the family has done some research on snf's and would really like [facility]. patient will be ready for discharge tomorrow. when CSW told patient that she said "that's the best news I've heard all day."   Assessment/plan status:   Other assessment/ plan:   Information/referral to community resources:    PATIENTS/FAMILYS RESPONSE TO PLAN OF CARE: patient in very good spirits and is motivated to go to snf short term and return home. patient has her heart set on [facility].

## 2013-04-08 NOTE — Progress Notes (Signed)
Call from central monitoring that patient's heart rate down to 28. Pt was asleep, awaken easily. She denies discomfort or distress. Radial pulse is now 60. Will cont to monitor.

## 2013-04-08 NOTE — Progress Notes (Signed)
ANTICOAGULATION CONSULT NOTE - Follow Up  Pharmacy Consult for Warfarin Indication: atrial fibrillation  No Known Allergies  Vital Signs: Temp: 97.8 F (36.6 C) (07/11 0417) Temp src: Oral (07/11 0417) BP: 133/79 mmHg (07/11 0417) Pulse Rate: 62 (07/11 0417)  Labs:  Recent Labs  04/06/13 1319 04/06/13 1919 04/07/13 0750 04/08/13 0414  HGB 15.7*  --  13.0 13.7  HCT 46.3*  --  39.7 41.9  PLT 211  --  188 183  LABPROT  --  23.2* 25.0* 23.9*  INR  --  2.14* 2.36* 2.22*  CREATININE 0.64  --  0.65 0.61  CKTOTAL 561*  --   --   --     Estimated Creatinine Clearance: 65 ml/min (by C-G formula based on Cr of 0.61).   Assessment: 79 yo F s/p fall found down by family on  7/9. On warfarin prior to admission for hx of Afib. Home warfarin dose reported as 1mg  daily. INR was therapeutic on admission. Transferred from [facility] to [facility] per patient request.  INR remains therapeutic this am  Will continue to follow usual home regimen  No bleeding events noted in chart notes.  H/H, plts wnl  Goal of Therapy:  INR 2-3   Plan:  1. Repeat Warfarin 1mg  PO x 1 tonight 2. Daily INR  Darrol Angel, PharmD Pager: [phone] 04/08/2013,7:24 AM

## 2013-04-08 NOTE — Clinical Social Work Placement (Signed)
     Clinical Social Work Department CLINICAL SOCIAL WORK PLACEMENT NOTE 04/08/2013  Patient:  Sabrina Delgado,Sabrina Delgado  Account Number:  [id number] Admit date:  04/06/2013  Clinical Social Worker:  Becky Sax, LCSW  Date/time:  04/08/2013 12:00 M  Clinical Social Work is seeking post-discharge placement for this patient at the following level of care:   SKILLED NURSING   (*CSW will update this form in Epic as items are completed)   04/08/2013  Patient/family provided with [facility] Department of Clinical Social Works list of facilities offering this level of care within the geographic area requested by the patient (or if unable, by the patients family).  04/08/2013  Patient/family informed of their freedom to choose among providers that offer the needed level of care, that participate in Medicare, Medicaid or managed care program needed by the patient, have an available bed and are willing to accept the patient.  04/08/2013  Patient/family informed of [facility] ownership interest in [facility], as well as of the fact that they are under no obligation to receive care at this facility.  PASARR submitted to EDS on 04/08/2013 PASARR number received from EDS on 04/08/2013  FL2 transmitted to all facilities in geographic area requested by pt/family on  04/08/2013 FL2 transmitted to all facilities within larger geographic area on   Patient informed that his/her managed care company has contracts with or will negotiate with  certain facilities, including the following:     Patient/family informed of bed offers received:  04/08/2013 Patient chooses bed at [facility] Physician recommends and patient chooses bed at    Patient to be transferred to [facility] on  04/09/2013 Patient to be transferred to facility by [facility]  The following physician request were entered in Epic:   Additional Comments:

## 2013-04-09 DIAGNOSIS — I10 Essential (primary) hypertension: Secondary | ICD-10-CM

## 2013-04-09 LAB — URINE CULTURE: Colony Count: >=100000

## 2013-04-09 LAB — CBC
MCV: 90.2 fL (ref 78.0–100.0)
Platelets: 183 10*3/uL (ref 150–400)
RBC: 4.47 MIL/uL (ref 3.87–5.11)
RDW: 15 % (ref 11.5–15.5)
WBC: 9.5 10*3/uL (ref 4.0–10.5)

## 2013-04-09 LAB — PROTIME-INR
INR: 1.89 — ABNORMAL HIGH (ref 0.00–1.49)
Prothrombin Time: 21.1 seconds — ABNORMAL HIGH (ref 11.6–15.2)

## 2013-04-09 LAB — BASIC METABOLIC PANEL
CO2: 28 mEq/L (ref 19–32)
Chloride: 104 mEq/L (ref 96–112)
Creatinine, Ser: 0.63 mg/dL (ref 0.50–1.10)
GFR calc Af Amer: >90 mL/min (ref >90)
Sodium: 141 mEq/L (ref 135–145)

## 2013-04-09 MED ORDER — CIPROFLOXACIN HCL 500 MG PO TABS
500.0000 mg | ORAL_TABLET | Freq: Two times a day (BID) | ORAL | Status: DC
  Administered 2013-04-09: 500 mg via ORAL
  Filled 2013-04-09 (×3): qty 1

## 2013-04-09 MED ORDER — CIPROFLOXACIN HCL 500 MG PO TABS: 500.0000 mg | ORAL_TABLET | Freq: Two times a day (BID) | ORAL | Status: DC

## 2013-04-09 NOTE — Progress Notes (Signed)
Pt stable and ready for discharge. Report called to [place]. [place] Haydon, Lavone Orn, RN
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
## 2013-04-09 NOTE — Progress Notes (Signed)
Per MD, Pt ready for d/c.  Notified RN, Pt, family and facility.  Admission to facility arranged by Weekday CSW.  Facility ready to receive Pt.  Arranged for transportation.  Providence Crosby, LCSWA Clinical Social Work [phone]

## 2013-04-09 NOTE — Progress Notes (Signed)
Brief Coumadin Consult Notes:  79 yo F on warfarin prior to admission for hx of Afib. Home warfarin dose reported as 1mg  daily. INR was therapeutic on admission. Patient presented s/p fall and head CT was negative. INR has been therapeutic inpatient on home dose of Coumadin but dropped to 1.89 this AM.  Discharge summaries completed for today with instruction to resume home dose of Coumadin and this is appropriate.   Plan: Pharmacy will not enter Coumadin dose for tonight. Please call if patient still here.    Geoffry Paradise, PharmD, BCPS Pager: [phone] 10:46 AM Pharmacy #: [phone]

## 2013-04-10 NOTE — ED Provider Notes (Signed)
Medical screening examination/treatment/procedure(s) were performed by non-physician practitioner and as supervising physician I was immediately available for consultation/collaboration.  Tregan Read T Roselin Wiemann, MD 04/10/13 0837 

## 2013-10-27 ENCOUNTER — Other Ambulatory Visit: Payer: Self-pay | Admitting: Family Medicine

## 2013-10-27 ENCOUNTER — Ambulatory Visit
Admission: RE | Admit: 2013-10-27 | Discharge: 2013-10-27 | Disposition: A | Discharge status: Home / Self Care | Payer: Medicare Other | Source: Ambulatory Visit | Attending: Family Medicine | Admitting: Family Medicine

## 2013-10-27 DIAGNOSIS — S20229A Contusion of unspecified back wall of thorax, initial encounter: Secondary | ICD-10-CM

## 2013-12-21 ENCOUNTER — Ambulatory Visit: Payer: Self-pay | Admitting: Podiatrist

## 2014-02-15 ENCOUNTER — Ambulatory Visit (INDEPENDENT_AMBULATORY_CARE_PROVIDER_SITE_OTHER): Payer: Medicare Other | Admitting: Podiatrist

## 2014-02-15 ENCOUNTER — Encounter: Payer: Self-pay | Admitting: Podiatrist

## 2014-02-15 VITALS — Ht 64.0 in | Wt 189.0 lb

## 2014-02-15 DIAGNOSIS — B351 Tinea unguium: Secondary | ICD-10-CM

## 2014-02-15 DIAGNOSIS — M79609 Pain in unspecified limb: Secondary | ICD-10-CM

## 2014-02-15 NOTE — Patient Instructions (Signed)

## 2014-02-15 NOTE — Progress Notes (Signed)
   Subjective:    Patient ID: Sabrina Delgado, female    DOB: 1932-07-02, 80 y.o.   MRN: [id number]  HPI Comments: Pt presents for debridement of 1 - 10 toenails, and evaluation of feet.     Review of Systems  All other systems reviewed and are negative.      Objective:   Physical Exam Patient is awake, alert, and oriented x 3.  In no acute distress.  Vascular status is intact with palpable pedal pulses at 1/4 DP and PT bilateral and capillary refill time within normal limits. Multiple variscosities present bilateral. Neurological sensation is also intact bilaterally via Semmes Weinstein monofilament at 5/5 sites. Light touch, vibratory sensation, Achilles tendon reflex is intact. Dermatological exam reveals skin color, turger and texture as acceptable for patients agel. No open lesions present.  Musculature intact with dorsiflexion, plantarflexion, inversion, eversion.  Toenails 1-5 bilateral are incurvated, thickened, discolored, dystrophic with subungual debris present.  She relates the podiatrist who has previously trimmed her nails has "messed up her toenails"  Her nails are very incurvated and appears  To be from age and shoe gear.   She has calluses on the right 3rd toe, and at the distal tips of bilateral great toes.   Assessment & Plan:  Mycotic and incurvated digital toenails x 10--  Plan:  Debridement of toenails was carried out today without complication.  i debrided the calluses down as well.  She will be seen back in 2-3 months for follow up for continued care or as needed.

## 2014-03-22 ENCOUNTER — Encounter: Payer: Self-pay | Admitting: *Deleted

## 2014-04-13 ENCOUNTER — Ambulatory Visit (INDEPENDENT_AMBULATORY_CARE_PROVIDER_SITE_OTHER): Payer: Medicare Other | Admitting: Podiatrist

## 2014-04-13 ENCOUNTER — Encounter: Payer: Self-pay | Admitting: Podiatrist

## 2014-04-13 VITALS — Ht 64.0 in | Wt 175.0 lb

## 2014-04-13 DIAGNOSIS — I872 Venous insufficiency (chronic) (peripheral): Secondary | ICD-10-CM

## 2014-04-13 DIAGNOSIS — M216X9 Other acquired deformities of unspecified foot: Secondary | ICD-10-CM

## 2014-04-13 DIAGNOSIS — Q828 Other specified congenital malformations of skin: Secondary | ICD-10-CM

## 2014-04-13 DIAGNOSIS — L84 Corns and callosities: Secondary | ICD-10-CM

## 2014-04-13 NOTE — Progress Notes (Signed)
   Subjective:    Patient ID: Sabrina Delgado, female    DOB: [birth year], 80 y.o.   MRN: [id number]  HPI Comments: Pt presents with complaint of right 1, 2nd toes swelling, no known injury or cause.  Pt states noticed for 1 week.  Toe Pain       Review of Systems  All other systems reviewed and are negative.      Objective:   Physical Exam Physical Exam  Patient is awake, alert, and oriented x 3. In no acute distress. Vascular status is intact with palpable pedal pulses at 1/4 DP and PT bilateral and capillary refill time within normal limits. Multiple variscosities present bilateral. Neurological sensation is also intact bilaterally via Semmes Weinstein monofilament at 5/5 sites. Light touch, vibratory sensation, Achilles tendon reflex is intact.   Dermatological exam reveals calluses bilateral halluces, 3rd tips of toes and 5th digit left. Toenails 1-5 bilateral are incurvated, thickened, discolored, dystrophic with subungual debris present. She relates the podiatrist who has previously trimmed her nails has "messed up her toenails" Her nails are very incurvated and appears To be from age and shoe gear.  She has swelling of the toes which she is also blaming her last podiatrist for.  Unlikely any of these problems caused by any type of previous nail trim.      Assessment & Plan:  Calluses x 4  Plan:  Debridement of calluses carried out.  She did not want a nail trim and came in too soon for it to be covered by her insurance.  I will see her back in August for the nail trim.

## 2014-05-18 ENCOUNTER — Ambulatory Visit (INDEPENDENT_AMBULATORY_CARE_PROVIDER_SITE_OTHER): Payer: Medicare Other | Admitting: Podiatrist

## 2014-05-18 DIAGNOSIS — M79676 Pain in unspecified toe(s): Principal | ICD-10-CM

## 2014-05-18 DIAGNOSIS — Q828 Other specified congenital malformations of skin: Secondary | ICD-10-CM

## 2014-05-18 DIAGNOSIS — M79609 Pain in unspecified limb: Secondary | ICD-10-CM

## 2014-05-18 DIAGNOSIS — B351 Tinea unguium: Secondary | ICD-10-CM

## 2014-05-18 DIAGNOSIS — M216X9 Other acquired deformities of unspecified foot: Secondary | ICD-10-CM

## 2014-05-18 DIAGNOSIS — I872 Venous insufficiency (chronic) (peripheral): Secondary | ICD-10-CM

## 2014-05-22 NOTE — Progress Notes (Signed)
HPI:  Patient presents today for follow up of foot and nail care. Denies any new complaints today.  Objective:  Patients chart is reviewed.  Vascular status reveals pedal pulses noted at 1 out of 4 dp and pt bilateral .  Neurological sensation is intact to [facility] monofilament bilateral.  Patients nails are thickened, discolored, distrophic, friable and brittle with yellow-brown discoloration. Calluses present bilateral feet. Patient subjectively relates they are painful with shoes and with ambulation of bilateral feet.  Assessment:  Symptomatic onychomycosis, calluses  Plan:  Discussed treatment options and alternatives.  The symptomatic toenails and calluses were debrided through manual an mechanical means without complication.  Return appointment recommended at routine intervals of 3 months    Marlowe Aschoff, DPM

## 2014-08-03 ENCOUNTER — Ambulatory Visit (INDEPENDENT_AMBULATORY_CARE_PROVIDER_SITE_OTHER): Payer: Medicare Other | Admitting: Podiatrist

## 2014-08-03 ENCOUNTER — Encounter: Payer: Self-pay | Admitting: Podiatrist

## 2014-08-03 DIAGNOSIS — B351 Tinea unguium: Secondary | ICD-10-CM

## 2014-08-03 DIAGNOSIS — L84 Corns and callosities: Secondary | ICD-10-CM

## 2014-08-03 DIAGNOSIS — R609 Edema, unspecified: Secondary | ICD-10-CM

## 2014-08-03 DIAGNOSIS — M79676 Pain in unspecified toe(s): Secondary | ICD-10-CM

## 2014-08-03 DIAGNOSIS — Q828 Other specified congenital malformations of skin: Secondary | ICD-10-CM

## 2014-08-08 NOTE — Progress Notes (Signed)
Expand All Collapse All   HPI: Patient presents today for follow up of foot and nail care. Denies any new complaints today.  Objective: Patients chart is reviewed. Vascular status reveals pedal pulses noted at 1 out of 4 dp and pt bilateral . Neurological sensation is intact to [facility] mon[facility] bilateral. Patients nails are thickened, discolored, distrophic, friable and brittle with yellow-brown discoloration. Calluses present bilateral feet. Patient subjectively relates they are painful with shoes and with ambulation of bilateral feet.  Assessment: Symptomatic onychomycosis, calluses  Plan: Discussed treatment options and alternatives. The symptomatic toenails and calluses were debrided through manual an mechanical means without complication. Return appointment recommended at routine intervals of 3 months

## 2014-10-05 ENCOUNTER — Encounter: Payer: Self-pay | Admitting: Podiatrist

## 2014-10-05 ENCOUNTER — Ambulatory Visit (INDEPENDENT_AMBULATORY_CARE_PROVIDER_SITE_OTHER): Payer: Medicare Other | Admitting: Podiatrist

## 2014-10-05 DIAGNOSIS — M79676 Pain in unspecified toe(s): Secondary | ICD-10-CM

## 2014-10-05 DIAGNOSIS — B351 Tinea unguium: Secondary | ICD-10-CM

## 2014-10-05 DIAGNOSIS — L84 Corns and callosities: Secondary | ICD-10-CM

## 2014-10-05 DIAGNOSIS — I872 Venous insufficiency (chronic) (peripheral): Secondary | ICD-10-CM

## 2014-10-05 NOTE — Progress Notes (Signed)
Expand All Collapse All   HPI: Patient presents today for follow up of foot and nail care. Relates painful toenails both feet.  Requests greater toenails not be trimmed due to swelling of the toes and pain.  Objective: Patients chart is reviewed. Vascular status reveals pedal pulses noted at 1 out of 4 dp and pt bilateral . Neurological sensation is intact to [facility] mon[facility] bilateral. Patients nails are thickened, discolored, distrophic, friable and brittle with yellow-brown discoloration. Calluses present bilateral feet . Patient subjectively relates they are painful with shoes and with ambulation of bilateral feet.  Assessment: Symptomatic onychomycosis, calluses  Plan: Discussed treatment options and alternatives. The symptomatic toenails and calluses were debrided through manual an mechanical means without complication. Return appointment recommended at routine intervals of 3 months

## 2014-12-07 ENCOUNTER — Ambulatory Visit: Payer: Medicare Other | Admitting: Podiatrist

## 2014-12-13 ENCOUNTER — Ambulatory Visit (INDEPENDENT_AMBULATORY_CARE_PROVIDER_SITE_OTHER): Payer: Medicare Other | Admitting: Podiatrist

## 2014-12-13 ENCOUNTER — Encounter: Payer: Self-pay | Admitting: Podiatrist

## 2014-12-13 DIAGNOSIS — M79676 Pain in unspecified toe(s): Secondary | ICD-10-CM

## 2014-12-13 DIAGNOSIS — B351 Tinea unguium: Secondary | ICD-10-CM

## 2014-12-28 NOTE — Progress Notes (Signed)
Expand All Collapse All   HPI: Patient presents today for follow up of foot and nail care. Relates painful toenails both feet.  Requests greater toenails not be trimmed due to swelling of the toes and pain.  Objective: Patients chart is reviewed. Vascular status reveals pedal pulses noted at 1 out of 4 dp and pt bilateral . Neurological sensation is intact to [facility] mon[facility] bilateral. Patients nails are thickened, discolored, distrophic, friable and brittle with yellow-brown discoloration. Calluses present bilateral feet at tips of toes 1,4,5 left and 1,3,5 right.  Integument is intact post debridement. Patient subjectively relates they are painful with shoes and with ambulation of bilateral feet.  Assessment: Symptomatic onychomycosis, calluses  Plan: Discussed treatment options and alternatives. The symptomatic toenails and calluses were debrided through manual an mechanical means without complication. Return appointment recommended at routine intervals of 3 months

## 2015-02-14 ENCOUNTER — Telehealth: Payer: Self-pay | Admitting: *Deleted

## 2015-02-14 NOTE — Telephone Encounter (Addendum)
Pt's dtr, Diane states pt has an appt on 02/20/2015 to get an injection and she would like to know if there is any medication the pt should be taken off of, prior to the appt.  I informed pt's dtr, Diane of Dr. Gabriel RungWagoner's recommendation.

## 2015-02-15 NOTE — Telephone Encounter (Signed)
I do not believe so.

## 2015-02-21 ENCOUNTER — Encounter: Payer: Self-pay | Admitting: Podiatrist

## 2015-02-21 ENCOUNTER — Ambulatory Visit (INDEPENDENT_AMBULATORY_CARE_PROVIDER_SITE_OTHER): Payer: Medicare Other | Admitting: Podiatrist

## 2015-02-21 VITALS — BP 131/74 | HR 77 | Temp 96.2°F | Resp 14

## 2015-02-21 DIAGNOSIS — M779 Enthesopathy, unspecified: Secondary | ICD-10-CM | POA: Diagnosis not present

## 2015-02-21 NOTE — Progress Notes (Signed)
    Expand All Collapse All   HPI: Patient presents today for follow up of foot and nail care. Relates painful toenails both feet.  Requests an injection in her feet to help with the pain she is experiencing in her toes.  She has swelling and states she takes a water pill.  She presents today with her daughter.  Objective: Patients chart is reviewed. Vascular status reveals pedal pulses noted at 1 out of 4 dp and pt bilateral . Neurological sensation is intact to [facility] monofilament bilateral. Patients nails are thickened, discolored, distrophic, friable and brittle with yellow-brown discoloration. Calluses present bilateral feet at tips of toes 1,4,5 left and 1,3,5 right.  Integument is intact post debridement. Patient subjectively relates they are painful with shoes and with ambulation of bilateral feet. Digital contractures of all digits is noted.  Pain at the metatarsal phalangeal joints present consistent with arthritis.    Assessment: Symptomatic onychomycosis, calluses, arthritis.   Plan: Discussed treatment options and alternatives. The symptomatic toenails and calluses were debrided through manual an mechanical means without complication. Injected the dorsum of the foot with dexamethasone and marcaine plain to see if this would help with the discomfort.   Return appointment recommended at routine intervals of 3 months

## 2015-03-02 ENCOUNTER — Ambulatory Visit: Payer: Medicare Other | Admitting: Podiatry

## 2015-03-14 ENCOUNTER — Telehealth: Payer: Self-pay | Admitting: *Deleted

## 2015-03-14 NOTE — Telephone Encounter (Signed)
Darl Pikes states a medical records release form had been sent to our office and please fax records to [phone].  Dr. Hyacinth Meeker needs these records to make a referral.

## 2015-05-24 ENCOUNTER — Ambulatory Visit: Payer: Medicare Other | Admitting: Podiatry

## 2015-06-03 ENCOUNTER — Encounter (HOSPITAL_COMMUNITY): Payer: Self-pay | Admitting: Nurse Practitioner

## 2015-06-03 ENCOUNTER — Emergency Department (HOSPITAL_COMMUNITY): Payer: Medicare Other

## 2015-06-03 ENCOUNTER — Emergency Department (HOSPITAL_COMMUNITY)
Admission: EM | Admit: 2015-06-03 | Discharge: 2015-06-03 | Disposition: A | Discharge status: Home / Self Care | Payer: Medicare Other | Attending: Emergency Medicine | Admitting: Emergency Medicine

## 2015-06-03 DIAGNOSIS — I10 Essential (primary) hypertension: Secondary | ICD-10-CM | POA: Diagnosis not present

## 2015-06-03 DIAGNOSIS — R29898 Other symptoms and signs involving the musculoskeletal system: Secondary | ICD-10-CM

## 2015-06-03 DIAGNOSIS — Z87440 Personal history of urinary (tract) infections: Secondary | ICD-10-CM | POA: Insufficient documentation

## 2015-06-03 DIAGNOSIS — Z8669 Personal history of other diseases of the nervous system and sense organs: Secondary | ICD-10-CM | POA: Insufficient documentation

## 2015-06-03 DIAGNOSIS — Z8619 Personal history of other infectious and parasitic diseases: Secondary | ICD-10-CM | POA: Diagnosis not present

## 2015-06-03 DIAGNOSIS — E785 Hyperlipidemia, unspecified: Secondary | ICD-10-CM | POA: Insufficient documentation

## 2015-06-03 DIAGNOSIS — M6281 Muscle weakness (generalized): Secondary | ICD-10-CM | POA: Insufficient documentation

## 2015-06-03 DIAGNOSIS — I4891 Unspecified atrial fibrillation: Secondary | ICD-10-CM | POA: Diagnosis not present

## 2015-06-03 DIAGNOSIS — I5030 Unspecified diastolic (congestive) heart failure: Secondary | ICD-10-CM | POA: Insufficient documentation

## 2015-06-03 DIAGNOSIS — Z7901 Long term (current) use of anticoagulants: Secondary | ICD-10-CM | POA: Diagnosis not present

## 2015-06-03 DIAGNOSIS — E669 Obesity, unspecified: Secondary | ICD-10-CM | POA: Insufficient documentation

## 2015-06-03 DIAGNOSIS — Z8719 Personal history of other diseases of the digestive system: Secondary | ICD-10-CM | POA: Insufficient documentation

## 2015-06-03 DIAGNOSIS — Z87891 Personal history of nicotine dependence: Secondary | ICD-10-CM | POA: Insufficient documentation

## 2015-06-03 DIAGNOSIS — Z79899 Other long term (current) drug therapy: Secondary | ICD-10-CM | POA: Diagnosis not present

## 2015-06-03 LAB — CBC WITH DIFFERENTIAL/PLATELET
Basophils Absolute: 0 10*3/uL (ref 0.0–0.1)
Basophils Relative: 0 % (ref 0–1)
EOS ABS: 0.1 10*3/uL (ref 0.0–0.7)
EOS PCT: 1 % (ref 0–5)
HCT: 43.8 % (ref 36.0–46.0)
Hemoglobin: 14.5 g/dL (ref 12.0–15.0)
LYMPHS ABS: 1.1 10*3/uL (ref 0.7–4.0)
LYMPHS PCT: 12 % (ref 12–46)
MCH: 29.8 pg (ref 26.0–34.0)
MCHC: 33.1 g/dL (ref 30.0–36.0)
MCV: 89.9 fL (ref 78.0–100.0)
MONO ABS: 0.6 10*3/uL (ref 0.1–1.0)
MONOS PCT: 7 % (ref 3–12)
Neutro Abs: 7.6 10*3/uL (ref 1.7–7.7)
Neutrophils Relative %: 80 % — ABNORMAL HIGH (ref 43–77)
PLATELETS: 186 10*3/uL (ref 150–400)
RBC: 4.87 MIL/uL (ref 3.87–5.11)
RDW: 14.3 % (ref 11.5–15.5)
WBC: 9.4 10*3/uL (ref 4.0–10.5)

## 2015-06-03 LAB — I-STAT CHEM 8, ED
BUN: 24 mg/dL — ABNORMAL HIGH (ref 6–20)
CALCIUM ION: 1.15 mmol/L (ref 1.13–1.30)
CHLORIDE: 106 mmol/L (ref 101–111)
Creatinine, Ser: 0.7 mg/dL (ref 0.44–1.00)
GLUCOSE: 104 mg/dL — AB (ref 65–99)
HCT: 46 % (ref 36.0–46.0)
Hemoglobin: 15.6 g/dL — ABNORMAL HIGH (ref 12.0–15.0)
Potassium: 3.8 mmol/L (ref 3.5–5.1)
Sodium: 140 mmol/L (ref 135–145)
TCO2: 23 mmol/L (ref 0–100)

## 2015-06-03 LAB — PROTIME-INR
INR: 2.03 — ABNORMAL HIGH (ref 0.00–1.49)
PROTHROMBIN TIME: 22.8 s — AB (ref 11.6–15.2)

## 2015-06-03 NOTE — ED Provider Notes (Signed)
CSN: [id number]     Arrival date & time 06/03/15  1939 History   First MD Initiated Contact with Patient 06/03/15 1950     Chief Complaint  Patient presents with  . Weakness    Right Leg     (Consider location/radiation/quality/duration/timing/severity/associated sxs/prior Treatment) HPI Comments: Patient normally walks with a walker.  She was out with her daughter shopping, she used the restroom and was unable to stand from the commode.  She did this with assistance but then was able unable to move her right leg forward.  This lasted until arrival here approximately 45 minutes.  No history of stroke, no headache, no loss of bowel or bladder control, no numbness or tingling.  Patient does report that she had a fall last week, but has been at her normal baseline since that time  The history is provided by the patient and a relative.    Past Medical History  Diagnosis Date  . Hypertension   . GERD (gastroesophageal reflux disease)   . Osteoarthritis, knee   . Atrial fibrillation     on coumadin Dr. Hyacinth Meeker goal INR 2-3, echo mild LA enl, Severe pulm htn 12/11, holter cont a fib nl rate 12/11. Repeat echo 2/13  . Obesity   . Glaucoma   . E. coli UTI   . Hyperlipidemia     goal LDL<130  . Cataract     glaucoma Dr. Dagoberto Ligas  . History of PFTs   . Coronary artery calcification     by CT angio in 2011  . Diastolic heart failure    Past Surgical History  Procedure Laterality Date  . Cataract extraction, bilateral  1 2012  . US echocardiography      Mild LA enlargement, severe pul htn 12/11, holter cont a fib nl rate 12/11. repeat echo 2/13 shows resolution of pulm htn., LAE, mod LVH   Family History  Problem Relation Age of Onset  . Heart disease Father   . Heart disease Brother   . Stomach cancer Mother    Social History  Substance Use Topics  . Smoking status: Former Smoker -- 1.00 packs/day for 25 years    Types: Cigarettes    Quit date: 09/29/1986  . Smokeless  tobacco: None  . Alcohol Use: No   OB History    No data available     Review of Systems  Constitutional: Negative for fever.  Respiratory: Negative for shortness of breath.   Cardiovascular: Negative for chest pain and leg swelling.  Genitourinary: Negative for dysuria.  Musculoskeletal: Positive for gait problem. Negative for back pain and arthralgias.  Neurological: Positive for weakness. Negative for dizziness, numbness and headaches.  All other systems reviewed and are negative.     Allergies  Review of patient's allergies indicates no known allergies.  Home Medications   Prior to Admission medications   Medication Sig Start Date End Date Taking? Authorizing Provider  acetaminophen (TYLENOL) 500 MG tablet Take 500 mg by mouth every 6 (six) hours as needed for moderate pain.   Yes Historical Provider, MD  furosemide (LASIX) 20 MG tablet Take 10 mg by mouth daily.   Yes Historical Provider, MD  lisinopril (PRINIVIL,ZESTRIL) 40 MG tablet Take 40 mg by mouth daily.   Yes Historical Provider, MD  pravastatin (PRAVACHOL) 40 MG tablet Take 40 mg by mouth daily.    Yes Historical Provider, MD  sertraline (ZOLOFT) 50 MG tablet Take 50 mg by mouth daily.   Yes Historical Provider, MD  traMADol (ULTRAM) 50 MG tablet Take 50 mg by mouth every 4 (four) hours as needed for pain.   Yes Historical Provider, MD  warfarin (COUMADIN) 1 MG tablet Take 2-3 mg by mouth every morning. Take  on Tuesday and thursdays and then take 3 mgs all other days   Yes Historical Provider, MD   BP 133/66 mmHg  Pulse 69  Temp(Src) 98.5 F (36.9 C) (Oral)  Resp 18  SpO2 96% Physical Exam  Constitutional: She is oriented to person, place, and time. She appears well-developed and well-nourished.  HENT:  Head: Normocephalic.  Eyes: Pupils are equal, round, and reactive to light.  Neck: Normal range of motion.  Cardiovascular: Normal rate and regular rhythm.   Pulmonary/Chest: Effort normal and breath  sounds normal.  Abdominal: Soft.  Musculoskeletal: Normal range of motion.  Neurological: She is alert and oriented to person, place, and time. She has normal strength. No cranial nerve deficit or sensory deficit.  Patient stood and ambulated at her baseline.  No walker was available at the time, but she had assistance of one person on each side with minimal support  Skin: Skin is warm. No rash noted. No erythema.  Nursing note and vitals reviewed.   ED Course  Procedures (including critical care time) Labs Review Labs Reviewed  CBC WITH DIFFERENTIAL/PLATELET - Abnormal; Notable for the following:    Neutrophils Relative % 80 (*)    All other components within normal limits  PROTIME-INR - Abnormal; Notable for the following:    Prothrombin Time 22.8 (*)    INR 2.03 (*)    All other components within normal limits  I-STAT CHEM 8, ED - Abnormal; Notable for the following:    BUN 24 (*)    Glucose, Bld 104 (*)    Hemoglobin 15.6 (*)    All other components within normal limits    Imaging Review Dg Lumbar Spine Complete  06/03/2015   CLINICAL DATA:  81 year old female who fell several days ago, now unable to move right lower extremity. Initial encounter.  EXAM: LUMBAR SPINE - COMPLETE 4+ VIEW  COMPARISON:  Lumbar radiographs 04/06/2013.  FINDINGS: Advanced osteopenia. Chronic thoracolumbar scoliosis. Chronic advanced osteoarthritic changes of the left hip joint with associated acetabulum protrusio. Same numbering system as in 2014. Chronic aortic calcified atherosclerosis. Lumbar segmentation does appear normal. Grade 1 anterolisthesis at L5-S1 has not significantly changed. Age-indeterminate L2 superior endplate compression fracture. Other lumbar levels appear stable and intact. T12 appears intact. Moderate to severe widespread lumbar facet degeneration. Grossly stable and intact sacrum.  IMPRESSION: 1. Age indeterminate L2 compression fracture, favor chronic. Lumbar MRI or whole-body  nuclear medicine bone scan would best confirm acuity. 2. Advanced osteopenia and lumbar degenerative changes. 3. Advanced left hip degenerative changes with acetabulum protrusio. 4. Chronic calcified aortic atherosclerosis.   Electronically Signed   By: Odessa Fleming M.D.   On: 06/03/2015 21:28   Ct Head Wo Contrast  06/03/2015   CLINICAL DATA:  81 year old female with lower extremity pain and weakness. Initial encounter.  EXAM: CT HEAD WITHOUT CONTRAST  TECHNIQUE: Contiguous axial images were obtained from the base of the skull through the vertex without intravenous contrast.  COMPARISON:  Head CT without contrast 04/06/2013.  FINDINGS: Visualized paranasal sinuses and mastoids are clear. No acute osseous abnormality identified. No acute orbit or scalp soft tissue findings.  Calcified atherosclerosis at the skull base. Stable cerebral volume. No ventriculomegaly. No midline shift, mass effect, or evidence of intracranial mass lesion. No  suspicious intracranial vascular hyperdensity. No cortically based acute infarct identified. Patchy mostly subcortical cerebral white matter hypodensity has not significantly changed since 2014. No acute intracranial hemorrhage identified.  IMPRESSION: No acute intracranial abnormality. Stable noncontrast CT appearance of the brain since 2014.   Electronically Signed   By: Odessa Fleming M.D.   On: 06/03/2015 21:18   I have personally reviewed and evaluated these images and lab results as part of my medical decision-making.   EKG Interpretation None     Patient CT scan and lumbar x-rays are within normal parameters.  No acute pathology causing her discomfort.  Her Coumadin level or INR is 2.03 and therapeutic level.  I discussed possible admission for TIA evaluation versus follow-up with her primary care physician.  The patient and her family agree that she will follow-up with her primary care physician MDM   Final diagnoses:  Weakness of right lower extremity         Earley Favor, NP 06/03/15 2212  Bethann Berkshire, MD 06/03/15 2228

## 2015-06-03 NOTE — ED Notes (Signed)
Pt is presented from home, report of leg pain after a day long of activities with family. Denies shortness of breath or chest pain.

## 2015-06-03 NOTE — Discharge Instructions (Signed)
Specific cause for your temporary paralysis/weakness been identified, tonight, your mother's head CT and lower back x-ray were within normal parameters.  Her lab work is normal not indicating any sign of infection.  You were offered admission for further evaluation versus follow-up with your primary care physician, you all discussed this and opted for office follow-up next week at any time.  There is new symptoms or worsening of present symptom.  Please return for further evaluation and admission

## 2015-06-03 NOTE — ED Notes (Signed)
Bed: [facility] Expected date:  Expected time:  Means of arrival:  Comments: EMS 81 yo female unable to ambulate-difficulty moving right leg

## 2015-08-09 ENCOUNTER — Emergency Department (HOSPITAL_COMMUNITY): Payer: Medicare Other

## 2015-08-09 ENCOUNTER — Emergency Department (HOSPITAL_COMMUNITY)
Admission: EM | Admit: 2015-08-09 | Discharge: 2015-08-09 | Disposition: A | Discharge status: Home / Self Care | Payer: Medicare Other | Attending: Emergency Medicine | Admitting: Emergency Medicine

## 2015-08-09 ENCOUNTER — Encounter (HOSPITAL_COMMUNITY): Payer: Self-pay | Admitting: Emergency Medicine

## 2015-08-09 DIAGNOSIS — Y93E1 Activity, personal bathing and showering: Secondary | ICD-10-CM | POA: Diagnosis not present

## 2015-08-09 DIAGNOSIS — E669 Obesity, unspecified: Secondary | ICD-10-CM | POA: Diagnosis not present

## 2015-08-09 DIAGNOSIS — Z79899 Other long term (current) drug therapy: Secondary | ICD-10-CM | POA: Insufficient documentation

## 2015-08-09 DIAGNOSIS — Z9842 Cataract extraction status, left eye: Secondary | ICD-10-CM | POA: Diagnosis not present

## 2015-08-09 DIAGNOSIS — Z8719 Personal history of other diseases of the digestive system: Secondary | ICD-10-CM | POA: Insufficient documentation

## 2015-08-09 DIAGNOSIS — I5030 Unspecified diastolic (congestive) heart failure: Secondary | ICD-10-CM | POA: Diagnosis not present

## 2015-08-09 DIAGNOSIS — Y998 Other external cause status: Secondary | ICD-10-CM | POA: Diagnosis not present

## 2015-08-09 DIAGNOSIS — I10 Essential (primary) hypertension: Secondary | ICD-10-CM | POA: Insufficient documentation

## 2015-08-09 DIAGNOSIS — Z87440 Personal history of urinary (tract) infections: Secondary | ICD-10-CM | POA: Insufficient documentation

## 2015-08-09 DIAGNOSIS — M1710 Unilateral primary osteoarthritis, unspecified knee: Secondary | ICD-10-CM | POA: Insufficient documentation

## 2015-08-09 DIAGNOSIS — Z7901 Long term (current) use of anticoagulants: Secondary | ICD-10-CM | POA: Diagnosis not present

## 2015-08-09 DIAGNOSIS — Y92121 Bathroom in nursing home as the place of occurrence of the external cause: Secondary | ICD-10-CM | POA: Insufficient documentation

## 2015-08-09 DIAGNOSIS — Z9841 Cataract extraction status, right eye: Secondary | ICD-10-CM | POA: Diagnosis not present

## 2015-08-09 DIAGNOSIS — S3992XA Unspecified injury of lower back, initial encounter: Secondary | ICD-10-CM | POA: Insufficient documentation

## 2015-08-09 DIAGNOSIS — Z8669 Personal history of other diseases of the nervous system and sense organs: Secondary | ICD-10-CM | POA: Diagnosis not present

## 2015-08-09 DIAGNOSIS — Z043 Encounter for examination and observation following other accident: Secondary | ICD-10-CM | POA: Diagnosis present

## 2015-08-09 DIAGNOSIS — G8929 Other chronic pain: Secondary | ICD-10-CM | POA: Insufficient documentation

## 2015-08-09 DIAGNOSIS — Z87891 Personal history of nicotine dependence: Secondary | ICD-10-CM | POA: Insufficient documentation

## 2015-08-09 DIAGNOSIS — W01198A Fall on same level from slipping, tripping and stumbling with subsequent striking against other object, initial encounter: Secondary | ICD-10-CM | POA: Diagnosis not present

## 2015-08-09 DIAGNOSIS — E785 Hyperlipidemia, unspecified: Secondary | ICD-10-CM | POA: Insufficient documentation

## 2015-08-09 DIAGNOSIS — W19XXXA Unspecified fall, initial encounter: Secondary | ICD-10-CM

## 2015-08-09 NOTE — ED Notes (Signed)
Pt is back in room from CT

## 2015-08-09 NOTE — ED Notes (Signed)
When called [facility] at 18:28, dispatcher stated there were 3 pt's to be picked up and transported before Sabrina Delgado.

## 2015-08-09 NOTE — Progress Notes (Signed)
CSW facilitated  placement for [facility].  FL2 prepared and signed by MD and faxed to facility.  NS to call ambulance when pt ready to go.  Pt/dtr updated and agreeable to plan.

## 2015-08-09 NOTE — ED Notes (Signed)
Report given to SNF by Lajuana RippleBrenda RN. Pt moved to pod c and [facility] by this RN

## 2015-08-09 NOTE — Discharge Instructions (Signed)
Please read attached information. If you experience any new or worsening signs or symptoms please return to the emergency room for evaluation. Please follow-up with your primary care provider or specialist as discussed. Please use medication prescribed only as directed and discontinue taking if you have any concerning signs or symptoms.   °

## 2015-08-09 NOTE — Care Management (Signed)
ED CM received [facility] daytime [facility]. [facility] presented to ED s/p mechanical fall. Patent's  family is requesting that she return to the facility but be moved to skilled unit at the facility. CM discussed with the facility< spoke with Peggy [phone] she states there have a bed available but will need FL2 and PAASAR completed prior to discharge from the ED.  ED CSW consulted also updated Charge Nurse [place] and [place] RN on [facility]. No further ED CM needs identified.

## 2015-08-09 NOTE — ED Notes (Signed)
Ordered pt a heart healthy dinner tray.

## 2015-08-09 NOTE — ED Notes (Addendum)
Pt given ice pack for hematoma on back and head.  PA at bedside speaking with family.

## 2015-08-09 NOTE — ED Notes (Signed)
Patient left at this time with all belongings. 

## 2015-08-09 NOTE — NC FL2 (Signed)
[place] MEDICAID FL2 LEVEL OF CARE SCREENING TOOL     IDENTIFICATION  Patient Name: Sabrina BattyDorothy Delgado Birthdate: 03-May-1932 Sex: female Admission Date (Current Location): 08/09/2015  [place]  [place]   Facility and Address:  [facility], [address], [place], [place] [address]      Provider Number: [id number]  Attending Physician Name and Address:  Marily MemosJason Mesner, MD  Relative Name and Phone Number:       Current Level of Care: Hospital Recommended Level of Care: Nursing Facility Prior Approval Number:    Date Approved/Denied:   PASRR Number:    Discharge Plan: SNF    Current Diagnoses: Patient Active Problem List   Diagnosis Date Noted  . Sepsis secondary to UTI (HCC) 04/06/2013  . Hypertension 10/30/2011  . Hyperlipidemia 10/30/2011  . GERD (gastroesophageal reflux disease) 10/30/2011  . Atrial fibrillation (HCC) 10/30/2011  . Obesity 10/30/2011  . Dyspnea 10/30/2011  . Other chronic pulmonary heart diseases 10/30/2011  . History of tobacco use 10/30/2011    Orientation ACTIVITIES/SOCIAL BLADDER RESPIRATION    Self, Time, Situation, Place  Active, Family supportive Continent Normal  BEHAVIORAL SYMPTOMS/MOOD NEUROLOGICAL BOWEL NUTRITION STATUS      Continent Diet (heart healthy)  PHYSICIAN VISITS COMMUNICATION OF NEEDS Height & Weight Skin  30 days Verbally 5\' 5"  (165.1 cm) 175 lbs. Normal          AMBULATORY STATUS RESPIRATION    Supervision limited Normal      Personal Care Assistance Level of Assistance  Bathing, Dressing, Feeding Bathing Assistance: Limited assistance Feeding assistance: Limited assistance Dressing Assistance: Limited assistance      Functional Limitations Info                SPECIAL CARE FACTORS FREQUENCY  PT (By licensed PT), OT (By licensed OT)     PT Frequency:  (5x/week) OT Frequency:  (5x/week)           Additional Factors Info                   Current Medications (08/09/2015): No current facility-administered medications for this encounter.   Current Outpatient Prescriptions  Medication Sig Dispense Refill  . furosemide (LASIX) 20 MG tablet Take 10 mg by mouth daily.    Marland Kitchen. HYDROcodone-acetaminophen (NORCO/VICODIN) 5-325 MG tablet Take 1 tablet by mouth every 8 (eight) hours as needed. pain  0  . lisinopril (PRINIVIL,ZESTRIL) 40 MG tablet Take 40 mg by mouth daily.    . pravastatin (PRAVACHOL) 40 MG tablet Take 40 mg by mouth daily.     . sertraline (ZOLOFT) 50 MG tablet Take 50 mg by mouth daily.    . traMADol (ULTRAM) 50 MG tablet Take 50 mg by mouth every 4 (four) hours as needed for pain.    Marland Kitchen. warfarin (COUMADIN) 1 MG tablet Take 2-3 mg by mouth every morning. Take 2mg  on Tuesday and thursdays and then take 3 mgs all other days     Do not use this list as official medication orders. Please verify with discharge summary.  Discharge Medications:   Medication List    ASK your doctor about these medications        furosemide 20 MG tablet  Commonly known as:  LASIX  Take 10 mg by mouth daily.     HYDROcodone-acetaminophen 5-325 MG tablet  Commonly known as:  NORCO/VICODIN  Take 1 tablet by mouth every 8 (eight) hours as needed. pain  lisinopril 40 MG tablet  Commonly known as:  PRINIVIL,ZESTRIL  Take 40 mg by mouth daily.     pravastatin 40 MG tablet  Commonly known as:  PRAVACHOL  Take 40 mg by mouth daily.     sertraline 50 MG tablet  Commonly known as:  ZOLOFT  Take 50 mg by mouth daily.     traMADol 50 MG tablet  Commonly known as:  ULTRAM  Take 50 mg by mouth every 4 (four) hours as needed for pain.     warfarin 1 MG tablet  Commonly known as:  COUMADIN  Take 2-3 mg by mouth every morning. Take  on Tuesday and thursdays and then take 3 mgs all other days        Relevant Imaging Results:  Relevant Lab Results:  Recent Labs    Additional Information    Kaileen Bronkema M, LCSW

## 2015-08-09 NOTE — Care Management Note (Signed)
Case Management Note  Patient Details  Name: Octaviano BattyDorothy Bohr MRN: [id number] Date of Birth: 28-Jun-1932  Subjective/Objective:                  81 year-old female presents via EMS status post fall.//[facility].  Action/Plan: Follow for disposition needs.   Expected Discharge Date:       08/09/15           Expected Discharge Plan:  Home w Home Health Services  In-House Referral:  NA  Discharge planning Services  CM Consult  Post Acute Care Choice:  Home Health Choice offered to:  Adult Children  DME Arranged:  N/A DME Agency:  NA  HH Arranged:  Disease Management HH Agency:  [facility]  Status of Service:  Completed, signed off  Medicare Important Message Given:    Date Medicare IM Given:    Medicare IM give by:    Date Additional Medicare IM Given:    Additional Medicare Important Message give by:     If discussed at Long Length of Stay Meetings, dates discussed:    Additional Comments: Gayla Benn J. Lucretia RoersWood, RN, BSN, [facility] [phone] Spoke with pt and daughterat bedside regarding discharge planning for [facility]. [facility].  Offered pt list of home health agencies to choose from.  Pt [facility] to [facility]. Samantha of [facility] notified.  No DME needs identified at this time.    Oletta CohnWood, Kayden Amend, RN 08/09/2015, 2:50 PM

## 2015-08-09 NOTE — ED Provider Notes (Signed)
CSN: [id number]     Arrival date & time 08/09/15  1109 History   First MD Initiated Contact with Patient 08/09/15 1110     Chief Complaint  Patient presents with  . Fall   HPI   81 year-old female presents via EMS status post fall. Patient was taking shower this morning when she slipped falling out of the shower hitting the back of her head against the wall. Patient denies loss of consciousness, reports that she was unable to get back to her feet at that time, had a life alert bracelet that she pressed calling EMS. At this time my evaluation patient reports that she has no pain in the back of her head, neck somewhat sore, lower lumbar sore. Patient denies headache, nausea, vomiting, chest pain, shortness of breath, abdominal pain, hip pain, lower extremity pain, swelling, edema. Patient states she is on blood thinners.   Past Medical History  Diagnosis Date  . Hypertension   . GERD (gastroesophageal reflux disease)   . Osteoarthritis, knee   . Atrial fibrillation (HCC)     on coumadin Dr. Hyacinth Meeker goal INR 2-3, echo mild LA enl, Severe pulm htn 12/11, holter cont a fib nl rate 12/11. Repeat echo 2/13  . Obesity   . Glaucoma   . E. coli UTI   . Hyperlipidemia     goal LDL<130  . Cataract     glaucoma Dr. Dagoberto Ligas  . History of PFTs   . Coronary artery calcification     by CT angio in 2011  . Diastolic heart failure [facility])    Past Surgical History  Procedure Laterality Date  . Cataract extraction, bilateral  1 2012  . US echocardiography      Mild LA enlargement, severe pul htn 12/11, holter cont a fib nl rate 12/11. repeat echo 2/13 shows resolution of pulm htn., LAE, mod LVH   Family History  Problem Relation Age of Onset  . Heart disease Father   . Heart disease Brother   . Stomach cancer Mother    Social History  Substance Use Topics  . Smoking status: Former Smoker -- 1.00 packs/day for 25 years    Types: Cigarettes    Quit date: 09/29/1986  . Smokeless tobacco: None   . Alcohol Use: No   OB History    No data available     Review of Systems  All other systems reviewed and are negative.   Allergies  Review of patient's allergies indicates no known allergies.  Home Medications   Prior to Admission medications   Medication Sig Start Date End Date Taking? Authorizing Provider  furosemide (LASIX) 20 MG tablet Take 10 mg by mouth daily.   Yes Historical Provider, MD  HYDROcodone-acetaminophen (NORCO/VICODIN) 5-325 MG tablet Take 1 tablet by mouth every 8 (eight) hours as needed. pain 08/04/15  Yes Historical Provider, MD  lisinopril (PRINIVIL,ZESTRIL) 40 MG tablet Take 40 mg by mouth daily.   Yes Historical Provider, MD  pravastatin (PRAVACHOL) 40 MG tablet Take 40 mg by mouth daily.    Yes Historical Provider, MD  sertraline (ZOLOFT) 50 MG tablet Take 50 mg by mouth daily.   Yes Historical Provider, MD  traMADol (ULTRAM) 50 MG tablet Take 50 mg by mouth every 4 (four) hours as needed for pain.   Yes Historical Provider, MD  warfarin (COUMADIN) 1 MG tablet Take 2-3 mg by mouth every morning. Take  on Tuesday and thursdays and then take 3 mgs all other days  Yes Historical Provider, MD   BP 145/96 mmHg  Pulse 54  Resp 19  SpO2 98%   Physical Exam  Constitutional: She is oriented to person, place, and time. She appears well-developed and well-nourished. No distress.  HENT:  Head: Normocephalic.  Head atraumatic, nontender to palpation  Neck: Normal range of motion. Neck supple.  Pulmonary/Chest: Effort normal.  Abdominal: Soft. She exhibits no mass. There is no tenderness. There is no rebound and no guarding.  Musculoskeletal: Normal range of motion. She exhibits tenderness. She exhibits no edema.  No CT or L-spine tenderness Chest nontender palpation, atraumatic, normal lung expansion, abdomen soft nontender, hips stable with AP and lateral compression, no pain to lower extremities or hip with internal/external rotation or flexion or  extension. Lower extremities atraumatic. Nontender to palpation of the back, atraumatic, pain with rolling to the lumbar spine.  Neurological: She is alert and oriented to person, place, and time.  Skin: Skin is warm and dry. She is not diaphoretic.  Psychiatric: She has a normal mood and affect. Her behavior is normal. Judgment and thought content normal.  Nursing note and vitals reviewed.     ED Course  Procedures (including critical care time) Labs Review Labs Reviewed - No data to display  Imaging Review No results found. I have personally reviewed and evaluated these images and lab results as part of my medical decision-making.   EKG Interpretation None      MDM   Final diagnoses:  Fall, initial encounter    Labs:  Imaging: CT head, DG cervical spine, DG lumbar- no significant findings  Consults:  Therapeutics:  Discharge Meds:   Assessment/Plan: Patient presents status post fall, no loss of consciousness, no neurological deficits, no pain. She is atraumatic. Patient's daughter is present at time of evaluation, requesting patient be transferred back to physical therapy. Patient has difficulty with safely ambulating due to lower extremity pain, this is chronic, localized in her feet bilateral. Patient will be discharged to physical therapy, encouraged follow-up with her primary care for reevaluation in 2-3 days. She is given strict return precautions, she verbalizes understanding and agreement for today's plan and had no further questions or concerns at time of discharge.         Eyvonne MechanicJeffrey Talmage Teaster, PA-C 08/09/15 1702  Marily MemosJason Mesner, MD 08/10/15 [phone]

## 2015-08-09 NOTE — ED Notes (Addendum)
Per [facility]- Pt was in the shower when she had a mechanical fall. Pt hit her head and is on blood thinners. Pt c.o. Lower back pain, head pain and neck pain. Pt in C-collar. Pt log rolled off of back board with J.H. PA. Pt is on Coumadin. Pt reports she was on the floor for about 45 minutes.

## 2015-08-09 NOTE — ED Notes (Signed)
Called patient's daughter for her with her permission. No answer.

## 2015-08-09 NOTE — ED Provider Notes (Signed)
Medical screening examination/treatment/procedure(s) were conducted as a shared visit with non-physician practitioner(s) and myself.  I personally evaluated the patient during the encounter.  81 year old female who had a mechanical fall in the bathtub and hit her head. Is on Coumadin. Also have a little bit of midline thoracic back pain.is tender and her back but no cervical tenderness. Neurologic exam otherwise intact. Secondary to her anticoagulation we will CT her head and we will also get a plain film of her back. If These are okay will attempt ambulation and discharge if normal.   EKG Interpretation None        Marily MemosJason Ivis Henneman, MD 08/09/15 1125

## 2015-08-09 NOTE — ED Provider Notes (Signed)
Patient will be going to skilled nursing facility for rehabilitation. She is in agreement with plan. Patient appears comfortable. I countersigned signed [place] 2 form.  Doug SouSam Rhonna Holster, MD 08/10/15 [phone]

## 2015-08-09 NOTE — [place] FL2 (Deleted)
[place] MEDICAID FL2 LEVEL OF CARE SCREENING TOOL     IDENTIFICATION  Patient Name: Sabrina BattyDorothy Cuda Birthdate: 03-May-1932 Sex: female Admission Date (Current Location): 08/09/2015  [place]  [id number]   Facility and Address[facility]  [facility], [address], [place], [place] [address]      Provider Number: [id number]  Attending Physician Name and Address:  Marily MemosJason Mesner, MD  Relative Name and Phone Number:       Current Level of Care: Hospital Recommended Level of Care: Nursing Facility Prior Approval Number:    Date Approved/Denied:   PASRR Number:    Discharge Plan: SNF    Current Diagnoses: Patient Active Problem List   Diagnosis Date Noted  . Sepsis secondary to UTI (HCC) 04/06/2013  . Hypertension 10/30/2011  . Hyperlipidemia 10/30/2011  . GERD (gastroesophageal reflux disease) 10/30/2011  . Atrial fibrillation (HCC) 10/30/2011  . Obesity 10/30/2011  . Dyspnea 10/30/2011  . Other chronic pulmonary heart diseases 10/30/2011  . History of tobacco use 10/30/2011    Orientation ACTIVITIES/SOCIAL BLADDER RESPIRATION    Self, Time, Situation, Place  Active, Family supportive Continent Normal  BEHAVIORAL SYMPTOMS/MOOD NEUROLOGICAL BOWEL NUTRITION STATUS      Continent Diet (heart healthy)  PHYSICIAN VISITS COMMUNICATION OF NEEDS Height & Weight Skin  30 days Verbally 5\' 5"  (165.1 cm) 175 lbs. Normal          AMBULATORY STATUS RESPIRATION    Supervision limited Normal      Personal Care Assistance Level of Assistance  Bathing, Dressing, Feeding Bathing Assistance: Limited assistance Feeding assistance: Limited assistance Dressing Assistance: Limited assistance      Functional Limitations Info                SPECIAL CARE FACTORS FREQUENCY  PT (By licensed PT), OT (By licensed OT)     PT Frequency:  (5x/week) OT Frequency:  (5x/week)           Additional Factors Info                   Current Medications (08/09/2015): No current facility-administered medications for this encounter.   Current Outpatient Prescriptions  Medication Sig Dispense Refill  . furosemide (LASIX) 20 MG tablet Take 10 mg by mouth daily.    Marland Kitchen. HYDROcodone-acetaminophen (NORCO/VICODIN) 5-325 MG tablet Take 1 tablet by mouth every 8 (eight) hours as needed. pain  0  . lisinopril (PRINIVIL,ZESTRIL) 40 MG tablet Take 40 mg by mouth daily.    . pravastatin (PRAVACHOL) 40 MG tablet Take 40 mg by mouth daily.     . sertraline (ZOLOFT) 50 MG tablet Take 50 mg by mouth daily.    . traMADol (ULTRAM) 50 MG tablet Take 50 mg by mouth every 4 (four) hours as needed for pain.    Marland Kitchen. warfarin (COUMADIN) 1 MG tablet Take 2-3 mg by mouth every morning. Take 2mg  on Tuesday and thursdays and then take 3 mgs all other days     Do not use this list as official medication orders. Please verify with discharge summary.  Discharge Medications:   Medication List    ASK your doctor about these medications        furosemide 20 MG tablet  Commonly known as:  LASIX  Take 10 mg by mouth daily.     HYDROcodone-acetaminophen 5-325 MG tablet  Commonly known as:  NORCO/VICODIN  Take 1 tablet by mouth every 8 (eight) hours as needed. pain  lisinopril 40 MG tablet  Commonly known as:  PRINIVIL,ZESTRIL  Take 40 mg by mouth daily.     pravastatin 40 MG tablet  Commonly known as:  PRAVACHOL  Take 40 mg by mouth daily.     sertraline 50 MG tablet  Commonly known as:  ZOLOFT  Take 50 mg by mouth daily.     traMADol 50 MG tablet  Commonly known as:  ULTRAM  Take 50 mg by mouth every 4 (four) hours as needed for pain.     warfarin 1 MG tablet  Commonly known as:  COUMADIN  Take 2-3 mg by mouth every morning. Take 2mg on Tuesday and thursdays and then take 3 mgs all other days        Relevant Imaging Results:  Relevant Lab Results:  Recent Labs    Additional Information    Marciano Mundt M, LCSW     

## 2015-10-31 DEATH — deceased

## 2016-09-30 IMAGING — DX DG LUMBAR SPINE COMPLETE 4+V
7 series · 7 of 7 positions shown · non-contrast
Comparison: June 03, 2015

CLINICAL DATA: Pain following fall earlier today. Lumbago with
bilateral lower extremity radicular symptoms.

EXAM:
LUMBAR SPINE - COMPLETE 4+ VIEW

[t lumbar spine ap]
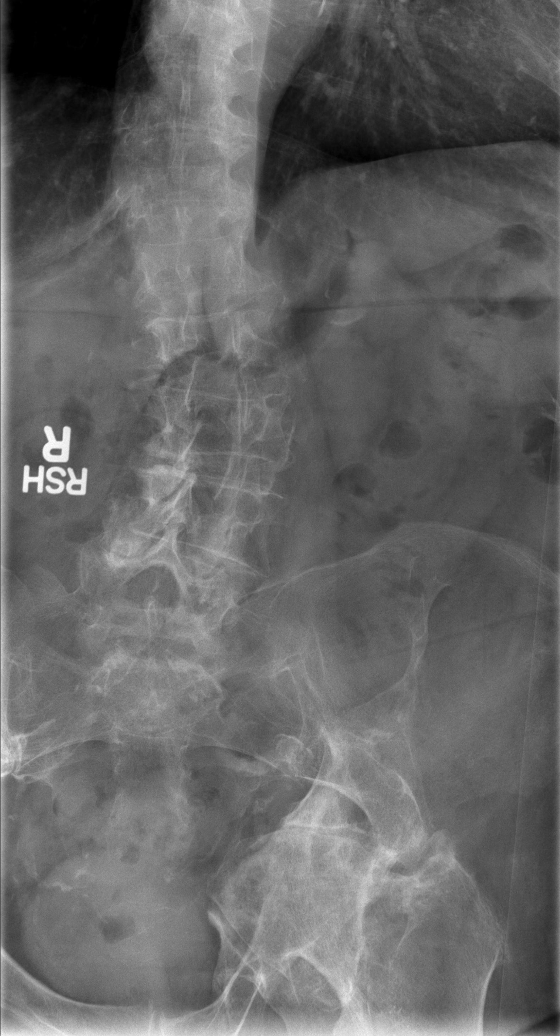

[t lumbar spine obl (1 of 4)]
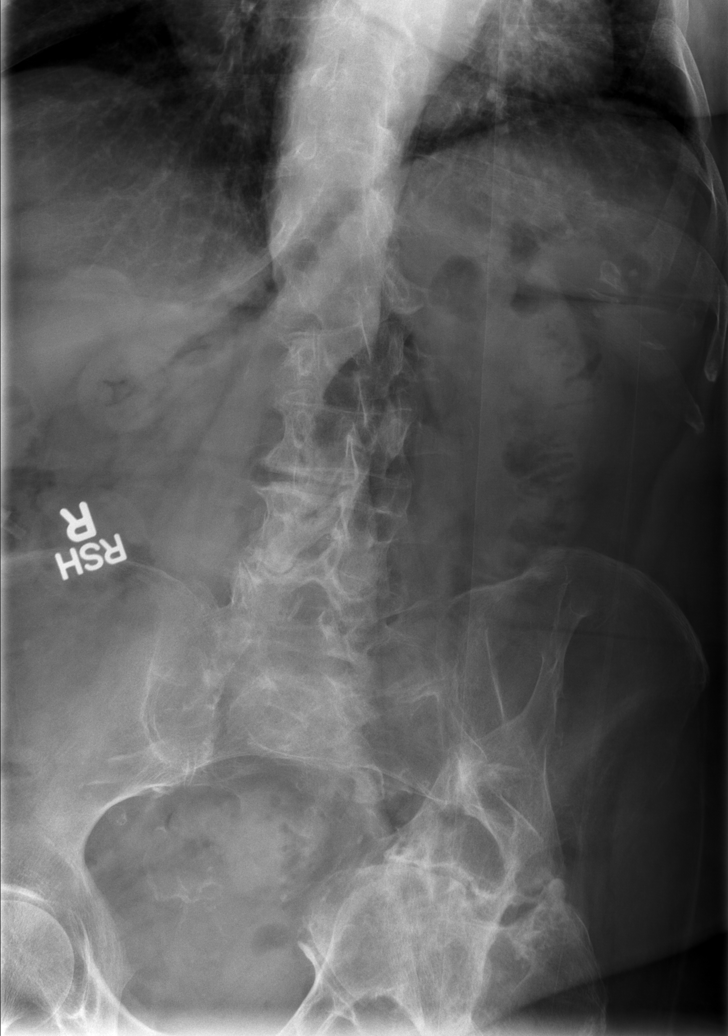

[t lumbar spine obl (2 of 4)]
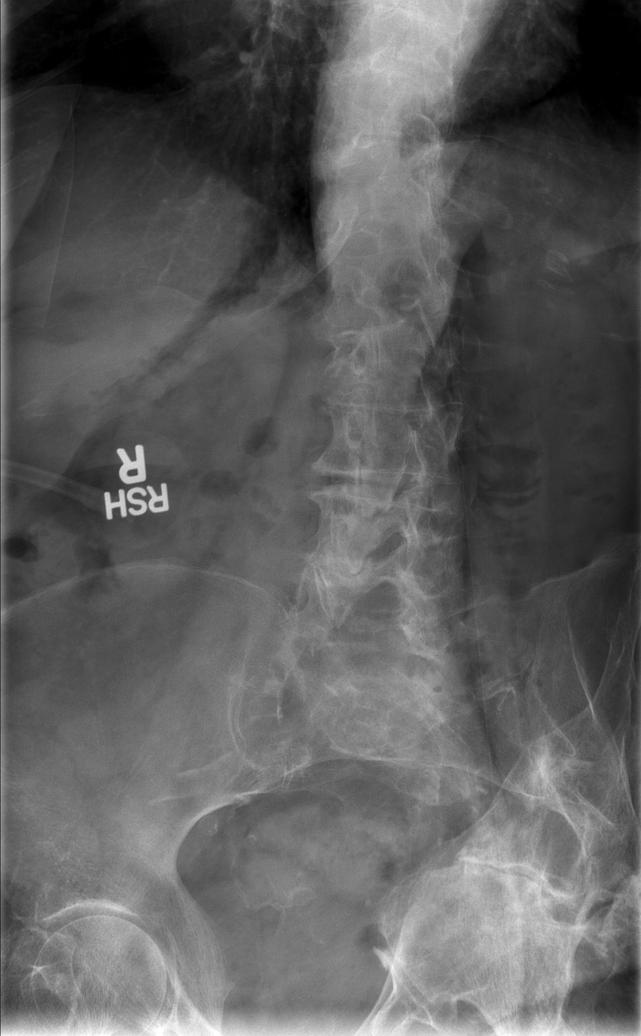

[t lumbar spine obl (3 of 4)]
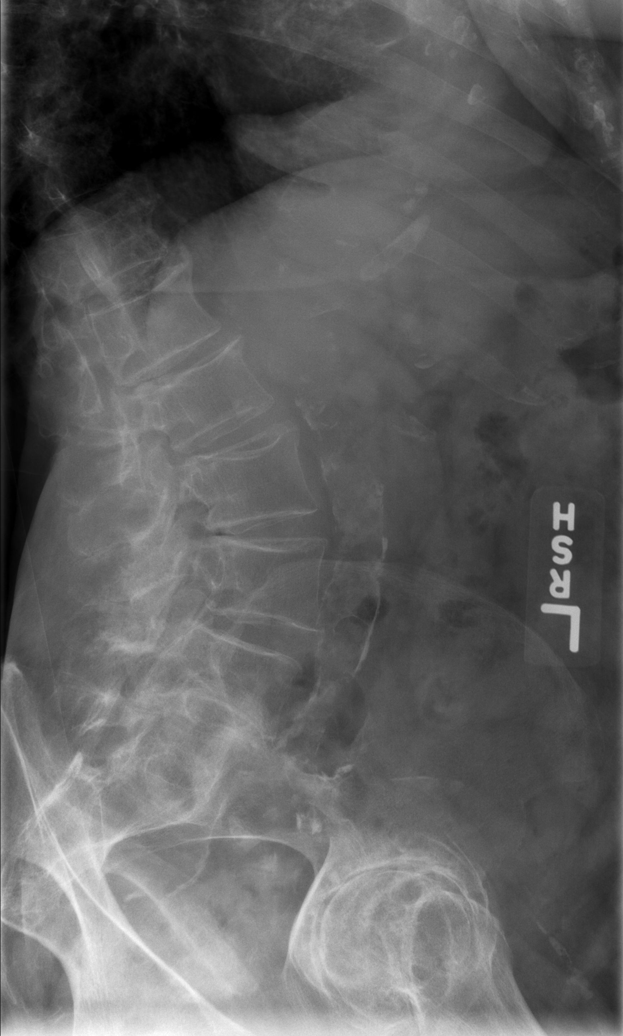

[t lumbar spine obl (4 of 4)]
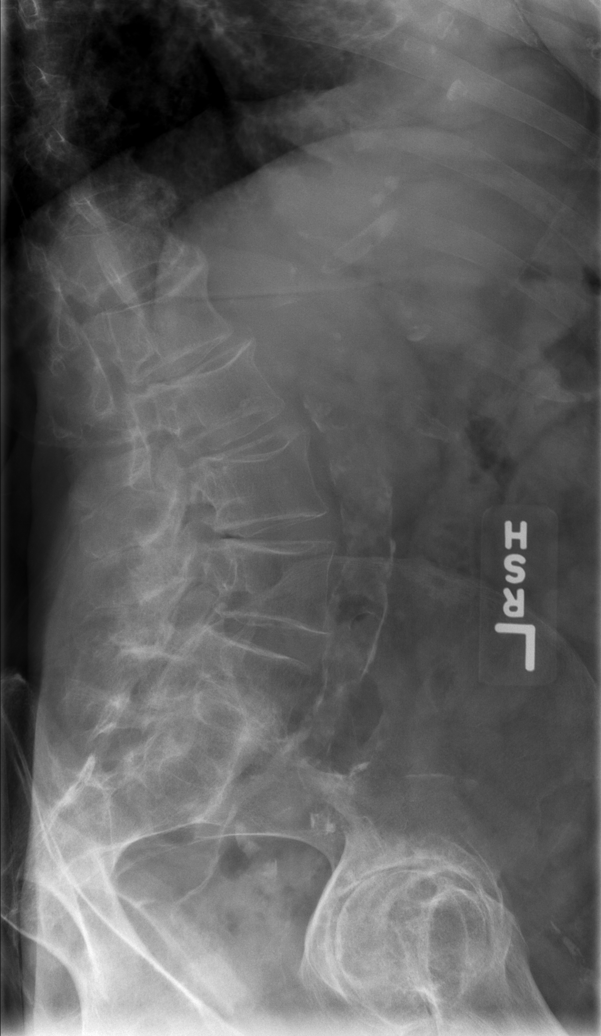

[t lumbar spine lat]
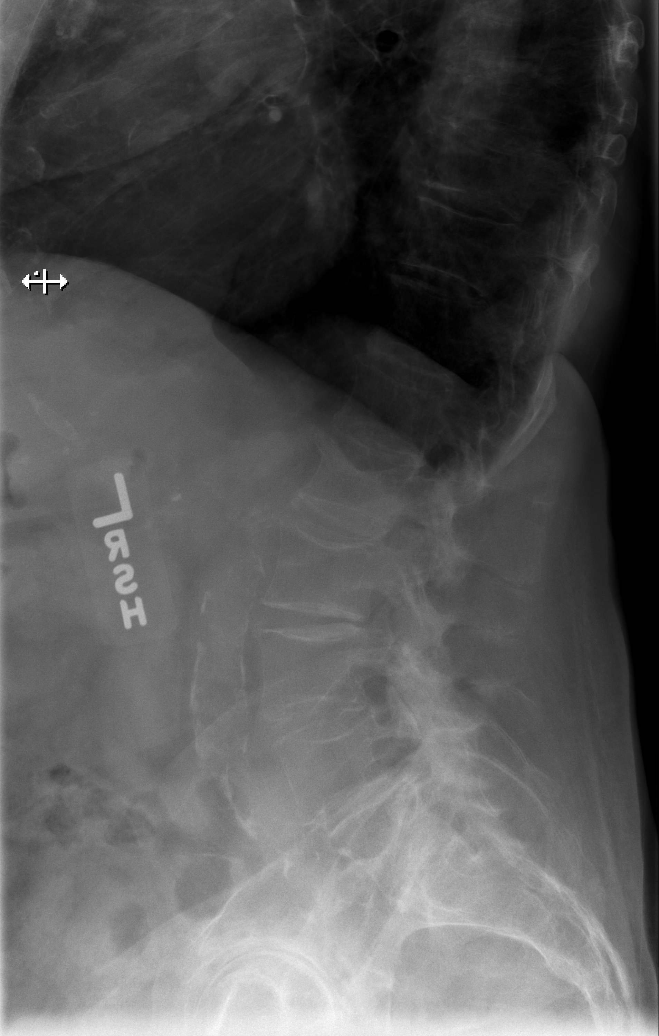

[t lumbar l-5 s-1 spot]
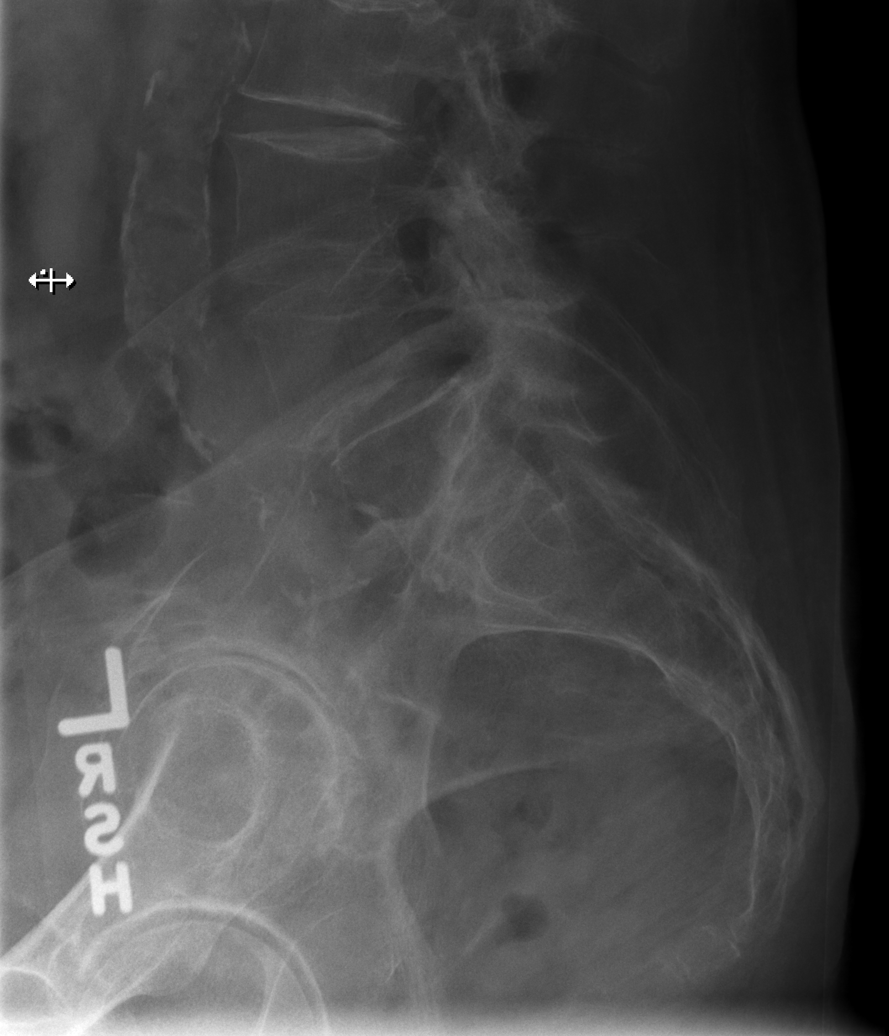

[7 of 7 positions shown; findings below may reference images not displayed]

FINDINGS: Frontal, lateral, spot lumbosacral lateral, and bilateral oblique
views were obtained. There are 5 non-rib-bearing lumbar type
vertebral bodies. Bones are osteoporotic. There is levoscoliosis in
the lumbar region with rotatory component. Anterior wedging of the
L2 vertebral body is stable. No new fracture apparent. 4 mm of
anterolisthesis of L5 on S1 remains stable. No new
spondylolisthesis. There is moderate disc space narrowing at all
levels, stable. There is facet osteoarthritic change at all levels
bilaterally, stable. Advanced arthropathy in the left hip joint
region with protrusio acetabuli on the left is stable. There is
atherosclerotic calcification in the aorta and iliac arteries.
IMPRESSION: Stable anterior wedging of the L2 vertebral body. No new fracture.
Mild spondylolisthesis at L5-S1 is stable. No new spondylolisthesis.

Extensive osteoarthritic change, stable. Bones osteoporotic.
Advanced arthropathy in the left hip joint with protrusio acetabuli
is stable. There is stable levoscoliosis in the lumbar region. There
is atherosclerotic calcification in the aorta.

Overall no appreciable change from study 2 months prior.
